# Patient Record
Sex: Male | Born: 1990 | Race: Black or African American | Hispanic: No | Marital: Single
Health system: Southern US, Community
[De-identification: ages and names within clinical notes are randomized; demographics above are authoritative.]

---
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
## 2009-01-02 ENCOUNTER — Emergency Department (HOSPITAL_COMMUNITY): Admission: EM | Admit: 2009-01-02 | Discharge: 2009-01-03 | Payer: Self-pay | Admitting: Emergency Medicine

## 2010-02-27 ENCOUNTER — Emergency Department (HOSPITAL_COMMUNITY): Admission: EM | Admit: 2010-02-27 | Discharge: 2010-02-27 | Payer: Self-pay | Admitting: Emergency Medicine

## 2012-03-30 ENCOUNTER — Emergency Department (INDEPENDENT_AMBULATORY_CARE_PROVIDER_SITE_OTHER): Payer: Self-pay

## 2012-03-30 ENCOUNTER — Emergency Department (HOSPITAL_COMMUNITY): Payer: Self-pay | Admitting: Anesthesiology

## 2012-03-30 ENCOUNTER — Emergency Department (INDEPENDENT_AMBULATORY_CARE_PROVIDER_SITE_OTHER)
Admission: EM | Admit: 2012-03-30 | Discharge: 2012-03-30 | Disposition: A | Discharge status: Other Acute Inpatient Hospital | Payer: Self-pay | Source: Home / Self Care | Attending: Family Medicine | Admitting: Family Medicine

## 2012-03-30 ENCOUNTER — Encounter (HOSPITAL_COMMUNITY)
Admission: EM | Disposition: A | Discharge status: Other Acute Inpatient Hospital | Payer: Self-pay | Source: Home / Self Care | Attending: Family Medicine

## 2012-03-30 ENCOUNTER — Emergency Department (HOSPITAL_COMMUNITY)
Admission: EM | Admit: 2012-03-30 | Discharge: 2012-03-31 | Disposition: A | Discharge status: Home / Self Care | Payer: Self-pay | Attending: Otolaryngology | Admitting: Otolaryngology

## 2012-03-30 ENCOUNTER — Encounter (HOSPITAL_COMMUNITY): Payer: Self-pay

## 2012-03-30 ENCOUNTER — Encounter (HOSPITAL_COMMUNITY): Payer: Self-pay | Admitting: Anesthesiology

## 2012-03-30 ENCOUNTER — Encounter (HOSPITAL_COMMUNITY): Payer: Self-pay | Admitting: Emergency Medicine

## 2012-03-30 ENCOUNTER — Encounter (HOSPITAL_COMMUNITY): Admission: EM | Disposition: A | Discharge status: Home / Self Care | Payer: Self-pay | Source: Home / Self Care

## 2012-03-30 DIAGNOSIS — S02609A Fracture of mandible, unspecified, initial encounter for closed fracture: Secondary | ICD-10-CM

## 2012-03-30 HISTORY — PX: ORIF MANDIBULAR FRACTURE: SHX2127

## 2012-03-30 SURGERY — CLOSED REDUCTION, MANDIBLE, WITH ARCH BAR APPLICATION AND INTERMAXILLARY FIXATION
Anesthesia: General

## 2012-03-30 SURGERY — OPEN REDUCTION INTERNAL FIXATION (ORIF) MANDIBULAR FRACTURE
Anesthesia: General | Site: Mouth | Wound class: Clean Contaminated

## 2012-03-30 MED ORDER — ONDANSETRON HCL 4 MG/2ML IJ SOLN
INTRAMUSCULAR | Status: DC | PRN
  Administered 2012-03-30: 4 mg via INTRAVENOUS

## 2012-03-30 MED ORDER — DROPERIDOL 2.5 MG/ML IJ SOLN
INTRAMUSCULAR | Status: DC | PRN
  Administered 2012-03-30: 0.625 mg via INTRAVENOUS

## 2012-03-30 MED ORDER — LIDOCAINE HCL 4 % MT SOLN
OROMUCOSAL | Status: DC | PRN
  Administered 2012-03-30: 4 mL via TOPICAL

## 2012-03-30 MED ORDER — CLINDAMYCIN PHOSPHATE 600 MG/50ML IV SOLN
INTRAVENOUS | Status: DC | PRN
  Administered 2012-03-30: 600 mg via INTRAVENOUS

## 2012-03-30 MED ORDER — MIDAZOLAM HCL 5 MG/5ML IJ SOLN
INTRAMUSCULAR | Status: DC | PRN
  Administered 2012-03-30: 2 mg via INTRAVENOUS

## 2012-03-30 MED ORDER — GLYCOPYRROLATE 0.2 MG/ML IJ SOLN
INTRAMUSCULAR | Status: DC | PRN
  Administered 2012-03-30: 0.2 mg via INTRAVENOUS

## 2012-03-30 MED ORDER — LACTATED RINGERS IV SOLN
INTRAVENOUS | Status: DC | PRN
  Administered 2012-03-30 – 2012-03-31 (×2): via INTRAVENOUS

## 2012-03-30 MED ORDER — DEXAMETHASONE SODIUM PHOSPHATE 10 MG/ML IJ SOLN: 10.0000 mg | Freq: Once | INTRAMUSCULAR | Status: DC

## 2012-03-30 MED ORDER — DEXAMETHASONE SODIUM PHOSPHATE 4 MG/ML IJ SOLN
INTRAMUSCULAR | Status: DC | PRN
  Administered 2012-03-30: 10 mg via INTRAVENOUS

## 2012-03-30 MED ORDER — SUFENTANIL CITRATE 50 MCG/ML IV SOLN
INTRAVENOUS | Status: DC | PRN
  Administered 2012-03-30: 30 ug via INTRAVENOUS
  Administered 2012-03-30: 20 ug via INTRAVENOUS

## 2012-03-30 MED ORDER — LIDOCAINE HCL (CARDIAC) 20 MG/ML IV SOLN
INTRAVENOUS | Status: DC | PRN
  Administered 2012-03-30: 100 mg via INTRAVENOUS

## 2012-03-30 MED ORDER — SUCCINYLCHOLINE CHLORIDE 20 MG/ML IJ SOLN
INTRAMUSCULAR | Status: DC | PRN
  Administered 2012-03-30: 100 mg via INTRAVENOUS

## 2012-03-30 MED ORDER — PROPOFOL 10 MG/ML IV EMUL
INTRAVENOUS | Status: DC | PRN
  Administered 2012-03-30: 160 mg via INTRAVENOUS
  Administered 2012-03-30: 40 mg via INTRAVENOUS

## 2012-03-30 SURGICAL SUPPLY — 41 items
CANISTER SUCTION 2500CC (MISCELLANEOUS) ×3 IMPLANT
CLEANER TIP ELECTROSURG 2X2 (MISCELLANEOUS) ×3 IMPLANT
CLOTH BEACON ORANGE TIMEOUT ST (SAFETY) ×3 IMPLANT
ELECT COATED BLADE 2.86 ST (ELECTRODE) ×3 IMPLANT
ELECT NEEDLE TIP 2.8 STRL (NEEDLE) IMPLANT
ELECT REM PT RETURN 9FT ADLT (ELECTROSURGICAL) ×3
ELECTRODE REM PT RTRN 9FT ADLT (ELECTROSURGICAL) ×2 IMPLANT
GLOVE BIOGEL M 7.0 STRL (GLOVE) ×3 IMPLANT
GLOVE BIOGEL PI IND STRL 7.5 (GLOVE) ×2 IMPLANT
GLOVE BIOGEL PI INDICATOR 7.5 (GLOVE) ×1
GLOVE SURG SS PI 7.0 STRL IVOR (GLOVE) ×3 IMPLANT
GOWN STRL NON-REIN LRG LVL3 (GOWN DISPOSABLE) ×6 IMPLANT
KIT BASIN OR (CUSTOM PROCEDURE TRAY) ×3 IMPLANT
KIT ROOM TURNOVER OR (KITS) ×3 IMPLANT
NS IRRIG 1000ML POUR BTL (IV SOLUTION) ×3 IMPLANT
PAD ARMBOARD 7.5X6 YLW CONV (MISCELLANEOUS) ×6 IMPLANT
PATTIES SURGICAL .5 X3 (DISPOSABLE) IMPLANT
PENCIL BUTTON HOLSTER BLD 10FT (ELECTRODE) ×3 IMPLANT
SCISSORS WIRE DISP (INSTRUMENTS) ×3 IMPLANT
SCREW UPPER FACE 2.0X12MM (Screw) ×6 IMPLANT
SCREW UPPER FACE 2.0X8MM (Screw) ×6 IMPLANT
STAPLER VISISTAT 35W (STAPLE) ×3 IMPLANT
SUT BONE WAX W31G (SUTURE) IMPLANT
SUT CHROMIC 3 0 PS 2 (SUTURE) IMPLANT
SUT ETHILON 4 0 P 3 18 (SUTURE) IMPLANT
SUT ETHILON 5 0 P 3 18 (SUTURE)
SUT NYLON ETHILON 5-0 P-3 1X18 (SUTURE) IMPLANT
SUT SILK 3 0 (SUTURE)
SUT SILK 3-0 18XBRD TIE 12 (SUTURE) IMPLANT
SUT STEEL 0 (SUTURE)
SUT STEEL 0 18XMFL TIE 17 (SUTURE) IMPLANT
SUT STEEL 2 (SUTURE) ×3 IMPLANT
SUT VIC AB 3-0 FS2 27 (SUTURE) IMPLANT
SUT VIC AB 4-0 P-3 18X BRD (SUTURE) IMPLANT
SUT VIC AB 4-0 P3 18 (SUTURE)
SUT VIC AB 5-0 P-3 18XBRD (SUTURE) IMPLANT
SUT VIC AB 5-0 P3 18 (SUTURE)
TOWEL OR 17X24 6PK STRL BLUE (TOWEL DISPOSABLE) ×3 IMPLANT
TOWEL OR 17X26 10 PK STRL BLUE (TOWEL DISPOSABLE) ×3 IMPLANT
TRAY ENT MC OR (CUSTOM PROCEDURE TRAY) ×3 IMPLANT
WATER STERILE IRR 1000ML POUR (IV SOLUTION) IMPLANT
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
## 2012-03-30 NOTE — ED Notes (Signed)
Call to OR ok to bring pt to OR after IV start the will get permit signed in the OR per Arline Asp RN pt to be brought to PACU

## 2012-03-30 NOTE — Preoperative (Signed)
Beta Blockers   Reason not to administer Beta Blockers:Not Applicable 

## 2012-03-30 NOTE — Anesthesia Preprocedure Evaluation (Signed)
Anesthesia Evaluation  Patient identified by MRN, date of birth, ID band Patient awake    Reviewed: Allergy & Precautions, H&P , NPO status , Patient's Chart, lab work & pertinent test results  Airway  TM Distance: >3 FB Neck ROM: Limited  Mouth opening: Limited Mouth Opening  Dental   Pulmonary Current Smoker,    Pulmonary exam normal       Cardiovascular     Neuro/Psych    GI/Hepatic   Endo/Other    Renal/GU      Musculoskeletal   Abdominal   Peds  Hematology   Anesthesia Other Findings Unable to assess Mallampati due to jaw pain  Reproductive/Obstetrics                           Anesthesia Physical Anesthesia Plan  ASA: II and Emergent  Anesthesia Plan: General   Post-op Pain Management:    Induction: Intravenous  Airway Management Planned: Nasal ETT and Video Laryngoscope Planned  Additional Equipment:   Intra-op Plan:   Post-operative Plan: Extubation in OR  Informed Consent: I have reviewed the patients History and Physical, chart, labs and discussed the procedure including the risks, benefits and alternatives for the proposed anesthesia with the patient or authorized representative who has indicated his/her understanding and acceptance.     Plan Discussed with: CRNA and Surgeon  Anesthesia Plan Comments:         Anesthesia Quick Evaluation
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
## 2012-03-31 MED ORDER — HYDROMORPHONE HCL PF 1 MG/ML IJ SOLN
INTRAMUSCULAR | Status: AC
  Filled 2012-03-31: qty 1

## 2012-03-31 MED ORDER — HYDROCODONE-ACETAMINOPHEN 7.5-500 MG/15ML PO SOLN: ORAL | Status: AC

## 2012-03-31 MED ORDER — LORAZEPAM 2 MG/ML IJ SOLN: 1.0000 mg | Freq: Once | INTRAMUSCULAR | Status: DC | PRN

## 2012-03-31 MED ORDER — 0.9 % SODIUM CHLORIDE (POUR BTL) OPTIME
TOPICAL | Status: DC | PRN
  Administered 2012-03-31: 1000 mL

## 2012-03-31 MED ORDER — LIDOCAINE-EPINEPHRINE 1 %-1:100000 IJ SOLN
INTRAMUSCULAR | Status: DC | PRN
  Administered 2012-03-31: 4 mL

## 2012-03-31 MED ORDER — HYDROMORPHONE HCL PF 1 MG/ML IJ SOLN
0.2500 mg | INTRAMUSCULAR | Status: DC | PRN
  Administered 2012-03-31 (×4): 0.5 mg via INTRAVENOUS

## 2012-03-31 MED ORDER — CLINDAMYCIN HCL 300 MG PO CAPS: 300.0000 mg | ORAL_CAPSULE | Freq: Three times a day (TID) | ORAL | Status: AC

## 2012-03-31 MED FILL — Bacitracin-Polymyxin-Neomycin HC Oint 1%: CUTANEOUS | Qty: 15 | Status: AC

## 2012-03-31 MED FILL — Oxymetazoline HCl Nasal Soln 0.05%: NASAL | Qty: 15 | Status: AC

## 2012-03-31 NOTE — Anesthesia Postprocedure Evaluation (Signed)
  Anesthesia Post-op Note  Patient: Jesus [name]  Procedure(s) Performed: Procedure(s) (LRB): OPEN REDUCTION INTERNAL FIXATION (ORIF) MANDIBULAR FRACTURE (N/A)  Patient Location: PACU  Anesthesia Type: General  Level of Consciousness: awake and alert   Airway and Oxygen Therapy: Patient Spontanous Breathing  Post-op Pain: mild  Post-op Assessment: Post-op Vital signs reviewed, Patient's Cardiovascular Status Stable, Respiratory Function Stable, Patent Airway, No signs of Nausea or vomiting and Pain level controlled  Post-op Vital Signs: stable  Complications: No apparent anesthesia complications

## 2012-03-31 NOTE — Brief Op Note (Signed)
03/30/2012 - 03/31/2012  12:34 AM  PATIENT:  Jesus [name]  21 y.o. male  PRE-OPERATIVE DIAGNOSIS:  Fractured Mandible  POST-OPERATIVE DIAGNOSIS:  Fractured Mandible  PROCEDURE:  Procedure(s) (LRB): OPEN REDUCTION with MANDIBULO-MAXILLARY FIXATION  SURGEON:  Surgeon(s) and Role:    * Osborn Coho, MD - Primary  PHYSICIAN ASSISTANT:   ASSISTANTS: none   ANESTHESIA:   general  EBL:  Total I/O In: 1000 [I.V.:1000] Out: - < 50cc  BLOOD ADMINISTERED:none  DRAINS: none   LOCAL MEDICATIONS USED:  LIDOCAINE  and Amount: 4 ml  SPECIMEN:  No Specimen  DISPOSITION OF SPECIMEN:  PATHOLOGY  COUNTS:  YES  TOURNIQUET:  * No tourniquets in log *  DICTATION: .Other Dictation: Dictation Number [id number]  PLAN OF CARE: Discharge to home after PACU  PATIENT DISPOSITION:  PACU - hemodynamically stable.   Delay start of Pharmacological VTE agent (>24hrs) due to surgical blood loss or risk of bleeding: not applicable
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
## 2012-03-31 NOTE — Transfer of Care (Signed)
Immediate Anesthesia Transfer of Care Note  Patient: Jesus [name]  Procedure(s) Performed: Procedure(s) (LRB): OPEN REDUCTION INTERNAL FIXATION (ORIF) MANDIBULAR FRACTURE (N/A)  Patient Location: PACU  Anesthesia Type: General  Level of Consciousness: awake, alert , oriented, sedated, patient cooperative and responds to stimulation  Airway & Oxygen Therapy: Patient Spontanous Breathing and Patient connected to nasal cannula oxygen  Post-op Assessment: Report given to PACU RN, Post -op Vital signs reviewed and stable and Patient moving all extremities  Post vital signs: Reviewed and stable  Complications: No apparent anesthesia complications

## 2012-03-31 NOTE — OR Nursing (Signed)
Entrance time to OR 2315 03/30/12.

## 2012-04-02 ENCOUNTER — Encounter (HOSPITAL_COMMUNITY): Payer: Self-pay | Admitting: Otolaryngology

## 2012-05-21 ENCOUNTER — Emergency Department (HOSPITAL_COMMUNITY)
Admission: EM | Admit: 2012-05-21 | Discharge: 2012-05-21 | Disposition: A | Discharge status: Home / Self Care | Payer: Self-pay | Attending: Emergency Medicine | Admitting: Emergency Medicine

## 2012-05-21 ENCOUNTER — Encounter (HOSPITAL_COMMUNITY): Payer: Self-pay

## 2012-05-21 DIAGNOSIS — K1379 Other lesions of oral mucosa: Secondary | ICD-10-CM

## 2012-05-21 DIAGNOSIS — R6884 Jaw pain: Secondary | ICD-10-CM | POA: Insufficient documentation

## 2012-05-21 MED ORDER — HYDROCODONE-ACETAMINOPHEN 7.5-500 MG/15ML PO SOLN: 10.0000 mL | Freq: Four times a day (QID) | ORAL | Status: DC | PRN
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
## 2012-05-21 NOTE — ED Provider Notes (Signed)
History  Scribed for Jesus Razor, MD, the patient was seen in room TR05C/TR05C. This chart was scribed by Candelaria Stagers. The patient's care started at 2:15 PM   CSN: [id number]  Arrival date & time 05/21/12  1307   First MD Initiated Contact with Patient 05/21/12 1411      Chief Complaint  Patient presents with  . Dental Pain   The history is provided by the patient.   Jesus [name] is a 21 y.o. male who presents to the Emergency Department complaining of increased right upper jaw pain after mandible fracture and surgery about one month ago.  Pt currently has mouth wired shut.  Pt reports that he spoke with MD earlier today and was referred here for imaging.  He is experiencing no other sx.       History reviewed. No pertinent past medical history.  Past Surgical History  Procedure Date  . Orif mandibular fracture 03/30/2012    Procedure: OPEN REDUCTION INTERNAL FIXATION (ORIF) MANDIBULAR FRACTURE;  Surgeon: Osborn Coho, MD;  Location: [facility] OR;  Service: ENT;  Laterality: N/A;    No family history on file.  History  Substance Use Topics  . Smoking status: Current Everyday Smoker  . Smokeless tobacco: Not on file  . Alcohol Use: Yes      Review of Systems  Constitutional: Negative for fever.       10 Systems reviewed and are negative for acute change except as noted in the HPI.  HENT: Negative for congestion.        Right jaw pain  Eyes: Negative for discharge and redness.  Respiratory: Negative for cough and shortness of breath.   Cardiovascular: Negative for chest pain.  Gastrointestinal: Negative for vomiting and abdominal pain.  Musculoskeletal: Negative for back pain.  Skin: Negative for rash.  Neurological: Negative for syncope, numbness and headaches.  Psychiatric/Behavioral:       No behavior change.    Allergies  Review of patient's allergies indicates no known allergies.  Home Medications  No current outpatient prescriptions on file.  BP  140/89  Pulse 66  Temp 97.4 F (36.3 C) (Oral)  Resp 16  Ht 5\' 7"  (1.702 m)  Wt 130 lb (58.968 kg)  BMI 20.36 kg/m2  SpO2 100%  Physical Exam  Nursing note and vitals reviewed. Constitutional:       Awake, alert, nontoxic appearance.  HENT:  Head: Atraumatic.       Jaw wired bilaterally. Mild irritation to buccal mucosa on right area where wires are located.   Eyes: Right eye exhibits no discharge. Left eye exhibits no discharge.  Neck: Neck supple.  Pulmonary/Chest: Effort normal. He exhibits no tenderness.  Abdominal: Soft. There is no tenderness. There is no rebound.  Musculoskeletal: He exhibits no tenderness.       Baseline ROM, no obvious new focal weakness.  Neurological:       Mental status and motor strength appears baseline for patient and situation.  Skin: No rash noted.  Psychiatric: He has a normal mood and affect.    ED Course  Procedures DIAGNOSTIC STUDIES: Oxygen Saturation is 100% on room air, normal by my interpretation.    COORDINATION OF CARE:    Labs Reviewed - No data to display No results found.   1. Mouth pain       MDM   21 year old male with cheek pain. This is from irritation from the wires of the ORIF of his mandible fracture. Patient states that he  was told to come the emergency room for x-rays. He denies any acute change in his pain or new trauma. There is no indication for emergent imaging at this time. Has scheduled ENT followup. Return precautions were discussed.  I personally preformed the services scribed in my presence. The recorded information has been reviewed and considered. Jesus Razor, MD.        Jesus Razor, MD 05/29/12 [phone]

## 2012-05-22 ENCOUNTER — Other Ambulatory Visit (HOSPITAL_COMMUNITY): Payer: Self-pay | Admitting: Otolaryngology

## 2012-05-22 ENCOUNTER — Ambulatory Visit (HOSPITAL_COMMUNITY)
Admission: RE | Admit: 2012-05-22 | Discharge: 2012-05-22 | Disposition: A | Discharge status: Home / Self Care | Payer: Self-pay | Source: Ambulatory Visit | Attending: Otolaryngology | Admitting: Otolaryngology

## 2012-05-22 DIAGNOSIS — S02650A Fracture of angle of mandible, unspecified side, initial encounter for closed fracture: Secondary | ICD-10-CM | POA: Insufficient documentation

## 2012-05-22 DIAGNOSIS — T148XXA Other injury of unspecified body region, initial encounter: Secondary | ICD-10-CM

## 2012-05-22 DIAGNOSIS — X58XXXA Exposure to other specified factors, initial encounter: Secondary | ICD-10-CM | POA: Insufficient documentation

## 2012-05-22 DIAGNOSIS — Z09 Encounter for follow-up examination after completed treatment for conditions other than malignant neoplasm: Secondary | ICD-10-CM | POA: Insufficient documentation

## 2012-05-26 ENCOUNTER — Encounter (HOSPITAL_BASED_OUTPATIENT_CLINIC_OR_DEPARTMENT_OTHER): Payer: Self-pay | Admitting: *Deleted

## 2012-05-27 ENCOUNTER — Encounter (HOSPITAL_BASED_OUTPATIENT_CLINIC_OR_DEPARTMENT_OTHER): Payer: Self-pay | Admitting: *Deleted

## 2012-05-27 NOTE — Progress Notes (Signed)
Bring all medications

## 2012-05-29 ENCOUNTER — Ambulatory Visit (HOSPITAL_BASED_OUTPATIENT_CLINIC_OR_DEPARTMENT_OTHER)
Admission: RE | Admit: 2012-05-29 | Discharge: 2012-05-29 | Disposition: A | Discharge status: Home / Self Care | Payer: Self-pay | Source: Ambulatory Visit | Attending: Otolaryngology | Admitting: Otolaryngology

## 2012-05-29 ENCOUNTER — Encounter (HOSPITAL_BASED_OUTPATIENT_CLINIC_OR_DEPARTMENT_OTHER): Payer: Self-pay | Admitting: Anesthesiology

## 2012-05-29 ENCOUNTER — Encounter (HOSPITAL_BASED_OUTPATIENT_CLINIC_OR_DEPARTMENT_OTHER)
Admission: RE | Disposition: A | Discharge status: Home / Self Care | Payer: Self-pay | Source: Ambulatory Visit | Attending: Otolaryngology

## 2012-05-29 ENCOUNTER — Encounter (HOSPITAL_BASED_OUTPATIENT_CLINIC_OR_DEPARTMENT_OTHER): Payer: Self-pay | Admitting: Otolaryngology

## 2012-05-29 ENCOUNTER — Encounter (HOSPITAL_BASED_OUTPATIENT_CLINIC_OR_DEPARTMENT_OTHER): Payer: Self-pay | Admitting: *Deleted

## 2012-05-29 ENCOUNTER — Ambulatory Visit (HOSPITAL_BASED_OUTPATIENT_CLINIC_OR_DEPARTMENT_OTHER): Payer: Self-pay | Admitting: Anesthesiology

## 2012-05-29 DIAGNOSIS — F17200 Nicotine dependence, unspecified, uncomplicated: Secondary | ICD-10-CM | POA: Insufficient documentation

## 2012-05-29 DIAGNOSIS — Z4789 Encounter for other orthopedic aftercare: Secondary | ICD-10-CM | POA: Insufficient documentation

## 2012-05-29 DIAGNOSIS — S02609A Fracture of mandible, unspecified, initial encounter for closed fracture: Secondary | ICD-10-CM

## 2012-05-29 HISTORY — PX: MANDIBULAR HARDWARE REMOVAL: SHX5205

## 2012-05-29 SURGERY — REMOVAL, HARDWARE, MANDIBLE
Anesthesia: General | Site: Mouth | Wound class: Clean Contaminated

## 2012-05-29 MED ORDER — LIDOCAINE HCL (CARDIAC) 20 MG/ML IV SOLN
INTRAVENOUS | Status: DC | PRN
  Administered 2012-05-29: 50 mg via INTRAVENOUS

## 2012-05-29 MED ORDER — FENTANYL CITRATE 0.05 MG/ML IJ SOLN: 50.0000 ug | INTRAMUSCULAR | Status: DC | PRN

## 2012-05-29 MED ORDER — ONDANSETRON HCL 4 MG/2ML IJ SOLN: 4.0000 mg | Freq: Four times a day (QID) | INTRAMUSCULAR | Status: DC | PRN

## 2012-05-29 MED ORDER — FENTANYL CITRATE 0.05 MG/ML IJ SOLN
INTRAMUSCULAR | Status: DC | PRN
  Administered 2012-05-29: 50 ug via INTRAVENOUS

## 2012-05-29 MED ORDER — PROPOFOL 10 MG/ML IV BOLUS
INTRAVENOUS | Status: DC | PRN
  Administered 2012-05-29: 200 ug via INTRAVENOUS

## 2012-05-29 MED ORDER — DEXAMETHASONE SODIUM PHOSPHATE 4 MG/ML IJ SOLN
INTRAMUSCULAR | Status: DC | PRN
  Administered 2012-05-29: 10 mg via INTRAVENOUS

## 2012-05-29 MED ORDER — CLINDAMYCIN HCL 300 MG PO CAPS: 300.0000 mg | ORAL_CAPSULE | Freq: Three times a day (TID) | ORAL | Status: AC

## 2012-05-29 MED ORDER — HYDROMORPHONE HCL PF 1 MG/ML IJ SOLN
0.2500 mg | INTRAMUSCULAR | Status: DC | PRN
  Administered 2012-05-29 (×6): 0.5 mg via INTRAVENOUS

## 2012-05-29 MED ORDER — LACTATED RINGERS IV SOLN
INTRAVENOUS | Status: DC
  Administered 2012-05-29 (×2): via INTRAVENOUS

## 2012-05-29 MED ORDER — LIDOCAINE-EPINEPHRINE 1 %-1:100000 IJ SOLN
INTRAMUSCULAR | Status: DC | PRN
  Administered 2012-05-29: 3 mL

## 2012-05-29 MED ORDER — MIDAZOLAM HCL 2 MG/2ML IJ SOLN: 1.0000 mg | INTRAMUSCULAR | Status: DC | PRN

## 2012-05-29 MED ORDER — CEFAZOLIN SODIUM-DEXTROSE 2-3 GM-% IV SOLR
INTRAVENOUS | Status: DC | PRN
  Administered 2012-05-29: 2 g via INTRAVENOUS

## 2012-05-29 MED ORDER — ONDANSETRON HCL 4 MG/2ML IJ SOLN
INTRAMUSCULAR | Status: DC | PRN
  Administered 2012-05-29: 4 mg via INTRAVENOUS

## 2012-05-29 SURGICAL SUPPLY — 31 items
BLADE SURG 15 STRL LF DISP TIS (BLADE) ×1 IMPLANT
BLADE SURG 15 STRL SS (BLADE) ×1
CANISTER SUCTION 1200CC (MISCELLANEOUS) ×2 IMPLANT
CLOTH BEACON ORANGE TIMEOUT ST (SAFETY) ×2 IMPLANT
COVER MAYO STAND STRL (DRAPES) ×2 IMPLANT
DECANTER SPIKE VIAL GLASS SM (MISCELLANEOUS) IMPLANT
ELECT COATED BLADE 2.86 ST (ELECTRODE) ×2 IMPLANT
ELECT REM PT RETURN 9FT ADLT (ELECTROSURGICAL) ×2
ELECTRODE REM PT RTRN 9FT ADLT (ELECTROSURGICAL) ×1 IMPLANT
GAUZE SPONGE 4X4 12PLY STRL LF (GAUZE/BANDAGES/DRESSINGS) ×4 IMPLANT
GAUZE SPONGE 4X4 16PLY XRAY LF (GAUZE/BANDAGES/DRESSINGS) IMPLANT
GLOVE BIOGEL M 7.0 STRL (GLOVE) ×2 IMPLANT
GLOVE BIOGEL M STRL SZ7.5 (GLOVE) ×2 IMPLANT
GOWN PREVENTION PLUS XLARGE (GOWN DISPOSABLE) ×2 IMPLANT
GOWN PREVENTION PLUS XXLARGE (GOWN DISPOSABLE) ×2 IMPLANT
MARKER SKIN DUAL TIP RULER LAB (MISCELLANEOUS) IMPLANT
NEEDLE 27GAX1X1/2 (NEEDLE) ×2 IMPLANT
NS IRRIG 1000ML POUR BTL (IV SOLUTION) IMPLANT
PACK BASIN DAY SURGERY FS (CUSTOM PROCEDURE TRAY) ×2 IMPLANT
PENCIL BUTTON HOLSTER BLD 10FT (ELECTRODE) ×2 IMPLANT
SCISSORS WIRE ANG 4 3/4 DISP (INSTRUMENTS) IMPLANT
SHEET MEDIUM DRAPE 40X70 STRL (DRAPES) ×2 IMPLANT
SUT CHROMIC 3 0 PS 2 (SUTURE) IMPLANT
SUT CHROMIC 4 0 PS 2 18 (SUTURE) IMPLANT
SUT CHROMIC 4 0 RB 1X27 (SUTURE) ×2 IMPLANT
SYR CONTROL 10ML LL (SYRINGE) ×2 IMPLANT
TOWEL OR 17X24 6PK STRL BLUE (TOWEL DISPOSABLE) ×2 IMPLANT
TRAY DSU PREP LF (CUSTOM PROCEDURE TRAY) IMPLANT
TUBE CONNECTING 20X1/4 (TUBING) ×2 IMPLANT
WATER STERILE IRR 1000ML POUR (IV SOLUTION) IMPLANT
YANKAUER SUCT BULB TIP NO VENT (SUCTIONS) ×2 IMPLANT

## 2012-05-29 NOTE — Transfer of Care (Signed)
Immediate Anesthesia Transfer of Care Note  Patient: Jesus [name]  Procedure(s) Performed: Procedure(s) (LRB): MANDIBULAR HARDWARE REMOVAL (N/A)  Patient Location: PACU  Anesthesia Type: General  Level of Consciousness: awake  Airway & Oxygen Therapy: Patient Spontanous Breathing and Patient connected to face mask oxygen  Post-op Assessment: Report given to PACU RN and Post -op Vital signs reviewed and stable  Post vital signs: Reviewed and stable  Complications: No apparent anesthesia complications

## 2012-05-29 NOTE — Brief Op Note (Signed)
05/29/2012  8:23 AM  PATIENT:  Mariann Laster  21 y.o. male  PRE-OPERATIVE DIAGNOSIS:  jaw fracture  POST-OPERATIVE DIAGNOSIS:  jaw fracture  PROCEDURE:  Procedure(s) (LRB): MANDIBULAR HARDWARE REMOVAL (N/A)  SURGEON:  Surgeon(s) and Role:    * Osborn Coho, MD - Primary  PHYSICIAN ASSISTANT:   ASSISTANTS: none   ANESTHESIA:   general  EBL:   Min  BLOOD ADMINISTERED:none  DRAINS: none   LOCAL MEDICATIONS USED:  LIDOCAINE  and Amount: 3 ml  SPECIMEN:  No Specimen  DISPOSITION OF SPECIMEN:  N/A  COUNTS:  YES  TOURNIQUET:  * No tourniquets in log *  DICTATION: .Other Dictation: Dictation Number [id number]  PLAN OF CARE: Discharge to home after PACU  PATIENT DISPOSITION:  PACU - hemodynamically stable.   Delay start of Pharmacological VTE agent (>24hrs) due to surgical blood loss or risk of bleeding: not applicable

## 2012-05-29 NOTE — H&P (Signed)
Jesus [name] is an 21 y.o. male.   Chief Complaint: MMF hardware HPI: s/p mandible frx x 2 mo.   History reviewed. No pertinent past medical history.  Past Surgical History  Procedure Date  . Orif mandibular fracture 03/30/2012    Procedure: OPEN REDUCTION INTERNAL FIXATION (ORIF) MANDIBULAR FRACTURE;  Surgeon: Osborn Coho, MD;  Location: [facility] OR;  Service: ENT;  Laterality: N/A;    History reviewed. No pertinent family history. Social History:  reports that he has been smoking Cigars.  He has never used smokeless tobacco. He reports that he drinks alcohol. He reports that he does not use illicit drugs.  Allergies: No Known Allergies  Medications Prior to Admission  Medication Sig Dispense Refill  . clindamycin (CLEOCIN) 300 MG capsule Take 300 mg by mouth 3 (three) times daily.      Marland Kitchen HYDROcodone-acetaminophen (LORTAB) 7.5-500 MG/15ML solution Take 10 mLs by mouth every 6 (six) hours as needed for pain.  120 mL  0    No results found for this or any previous visit (from the past 48 hour(s)). No results found.  Review of Systems  Constitutional: Negative.   Respiratory: Negative.   Cardiovascular: Negative.   Musculoskeletal: Negative.   Neurological: Negative.     Blood pressure 114/78, pulse 94, temperature 97.6 F (36.4 C), temperature source Oral, resp. rate 16, height 5\' 6"  (1.676 m), weight 55.566 kg (122 lb 8 oz), SpO2 99.00%. Physical Exam  Constitutional: He is oriented to person, place, and time. He appears well-developed and well-nourished.  HENT:       MMF inplace, Frx stable  Neck: Normal range of motion. Neck supple.  Cardiovascular: Normal rate and regular rhythm.   Respiratory: Effort normal and breath sounds normal.  GI: Soft.  Musculoskeletal: Normal range of motion.  Neurological: He is alert and oriented to person, place, and time.     Assessment/Plan Adm for OP removal of MMF hardware.  Jesus [name] 05/29/2012, 7:33 AM

## 2012-05-29 NOTE — Op Note (Signed)
NAME:  [name], Jesus NO.:  [id number]  MEDICAL RECORD NO.:  [id number]  LOCATION:                                 FACILITY:  PHYSICIAN:  Kinnie Scales. Annalee Genta, M.D.DATE OF BIRTH:  [birth year]  DATE OF PROCEDURE:  05/29/2012 DATE OF DISCHARGE:                              OPERATIVE REPORT   PREOPERATIVE DIAGNOSES: 1. Mandible fracture. 2. Status post mandibulomaxillary fixation.  POSTOPERATIVE DIAGNOSES: 1. Mandible fracture. 2. Status post mandibulomaxillary fixation.  INDICATIONS FOR SURGERY: 1. Mandible fracture. 2. Status post mandibulomaxillary fixation.  SURGICAL PROCEDURE:  Removal of mandibulomaxillary fixation hardware.  ANESTHESIA:  General/mask ventilation.  COMPLICATIONS:  No complications.  BLOOD LOSS:  No blood loss.  The patient transferred from the operating room to the recovery room in stable condition.  BRIEF HISTORY:  The patient is a 21 year old black male, who was admitted to [facility] approximately 2 months ago with an acute mandible fracture.  X-rays including Panorex and CT scan showed a displaced fracture of the left mandibular angle.  Given the patient's history, examination and findings, I recommended closed reduction and mandibulomaxillary fixation, which was performed on an emergency basis at [facility]. Approximately 8 weeks later when the fracture had adequately healed, a repeat Panorex was performed which showed initial healing of the fracture site.  The patient was stable and no symptoms of infection or other findings, and given his history and examination, I recommended removal of his mandibulomaxillary fixation hardware.  The risks and benefits of the procedure were discussed in detail with the patient prior to surgery and he understood and concurred with our plan for surgery which is scheduled as an outpatient under general and sedated anesthesia.  PROCEDURE:  The patient was brought to the  operating room at [facility] on May 29, 2012, placed in supine position on the operating table.  General mask ventilation anesthesia was established without difficulty.  When the patient adequately anesthetized, he was injected with a total of 3 mL of 1% lidocaine and 1:100,000 solution of epinephrine injected in a submucosal fashion overlying the mandibulomaxillary fixation screws.  The patient's wiring was then cut and incisions were made overlying his fixation screws. These were carefully removed, including all wires and hardware. Interrupted 3-0 chromic sutures were placed at the incision site overlying the screws.  The patient's oral cavity was irrigated and suctioned.  He was then awakened from his anesthetic and was transferred from the operating room to the recovery room in stable condition.  There were no complications.  The blood loss was minimal.          ______________________________ Kinnie Scales. Annalee Genta, M.D.     DLS/MEDQ  D:  16/07/9603  T:  05/29/2012  Job:  [id number]

## 2012-05-29 NOTE — Anesthesia Preprocedure Evaluation (Addendum)
Anesthesia Evaluation  Patient identified by MRN, date of birth, ID band Patient awake    Reviewed: Allergy & Precautions, H&P , NPO status , Patient's Chart, lab work & pertinent test results  Airway  TM Distance: >3 FB Neck ROM: full   Comment: Mouth is wired shut.   Dental   Pulmonary Current Smoker,          Cardiovascular     Neuro/Psych    GI/Hepatic   Endo/Other    Renal/GU      Musculoskeletal   Abdominal   Peds  Hematology   Anesthesia Other Findings   Reproductive/Obstetrics                          Anesthesia Physical Anesthesia Plan  ASA: II  Anesthesia Plan: General   Post-op Pain Management:    Induction: Intravenous  Airway Management Planned:   Additional Equipment:   Intra-op Plan:   Post-operative Plan: Extubation in OR  Informed Consent: I have reviewed the patients History and Physical, chart, labs and discussed the procedure including the risks, benefits and alternatives for the proposed anesthesia with the patient or authorized representative who has indicated his/her understanding and acceptance.     Plan Discussed with: CRNA and Surgeon  Anesthesia Plan Comments:         Anesthesia Quick Evaluation

## 2012-05-29 NOTE — Anesthesia Postprocedure Evaluation (Signed)
Anesthesia Post Note  Patient: Jesus [name]  Procedure(s) Performed: Procedure(s) (LRB): MANDIBULAR HARDWARE REMOVAL (N/A)  Anesthesia type: General  Patient location: PACU  Post pain: Pain level controlled and Adequate analgesia  Post assessment: Post-op Vital signs reviewed, Patient's Cardiovascular Status Stable, Respiratory Function Stable, Patent Airway and Pain level controlled  Last Vitals:  Filed Vitals:   05/29/12 0830  BP: 117/62  Pulse: 101  Temp:   Resp: 22    Post vital signs: Reviewed and stable  Level of consciousness: awake, alert  and oriented  Complications: No apparent anesthesia complications

## 2012-05-29 NOTE — Progress Notes (Signed)
Pt. Discharged from PACU PhII to lobby with mother awaiting ride per pt. Request. Ok'd with Consulting civil engineer.

## 2012-05-29 NOTE — Anesthesia Procedure Notes (Addendum)
Date/Time: 05/29/2012 8:03 AM Performed by: Zenia Resides D   Date/Time: 05/29/2012 8:03 AM Performed by: Zenia Resides D Pre-anesthesia Checklist: Patient identified, Emergency Drugs available, Suction available, Patient being monitored and Timeout performed Patient Re-evaluated:Patient Re-evaluated prior to inductionOxygen Delivery Method: Circle system utilized Intubation Type: Inhalational induction Ventilation: Mask ventilation without difficulty and Nasal airway inserted- appropriate to patient size Grade View: Grade I

## 2012-06-02 ENCOUNTER — Encounter (HOSPITAL_BASED_OUTPATIENT_CLINIC_OR_DEPARTMENT_OTHER): Payer: Self-pay | Admitting: Otolaryngology

## 2012-10-22 ENCOUNTER — Encounter (HOSPITAL_COMMUNITY): Payer: Self-pay | Admitting: Emergency Medicine

## 2012-10-22 DIAGNOSIS — R509 Fever, unspecified: Secondary | ICD-10-CM | POA: Insufficient documentation

## 2012-10-22 DIAGNOSIS — R112 Nausea with vomiting, unspecified: Secondary | ICD-10-CM | POA: Insufficient documentation

## 2012-10-22 DIAGNOSIS — R1084 Generalized abdominal pain: Secondary | ICD-10-CM | POA: Insufficient documentation

## 2012-10-22 DIAGNOSIS — F17200 Nicotine dependence, unspecified, uncomplicated: Secondary | ICD-10-CM | POA: Insufficient documentation

## 2012-10-22 DIAGNOSIS — R197 Diarrhea, unspecified: Secondary | ICD-10-CM | POA: Insufficient documentation

## 2012-10-22 NOTE — ED Notes (Addendum)
Pt arrived by ptar. Pt c.o of nv and fever for 2 days. Pt able to tolerate liquids but not solids. BP- 126/86 P 10 R 18 98% RA. Pt also c/o back pain

## 2012-10-23 ENCOUNTER — Emergency Department (HOSPITAL_COMMUNITY)
Admission: EM | Admit: 2012-10-23 | Discharge: 2012-10-23 | Disposition: A | Discharge status: Home / Self Care | Payer: Self-pay | Attending: Emergency Medicine | Admitting: Emergency Medicine

## 2012-10-23 DIAGNOSIS — R197 Diarrhea, unspecified: Secondary | ICD-10-CM

## 2012-10-23 MED ORDER — ONDANSETRON 4 MG PO TBDP
8.0000 mg | ORAL_TABLET | Freq: Once | ORAL | Status: AC
  Administered 2012-10-23: 8 mg via ORAL
  Filled 2012-10-23: qty 2

## 2012-10-23 MED ORDER — PROMETHAZINE HCL 25 MG PO TABS: 25.0000 mg | ORAL_TABLET | Freq: Four times a day (QID) | ORAL | Status: DC | PRN

## 2012-10-23 NOTE — ED Provider Notes (Signed)
History     CSN: [id number]  Arrival date & time 10/22/12  2144   First MD Initiated Contact with Patient 10/23/12 0002      Chief Complaint  Patient presents with  . Emesis  . Fever    (Consider location/radiation/quality/duration/timing/severity/associated sxs/prior treatment) HPI Comments: Patient presents today with a chief complaint of nausea, vomiting, diarrhea, and diffuse abdominal pain.  Abdominal pain does not radiate.  He reports that his symptoms have been present since earlier today.  He also reports that he has felt warm, but has not taken his temperature.  He denies any blood in his emesis or blood in his stool.  He had two episodes of vomiting and three episodes of diarrhea today.  He denies any urinary symptoms.  No prior abdominal surgeries.  Patient is a 22 y.o. male presenting with vomiting. The history is provided by the patient.  Emesis  The emesis has an appearance of stomach contents. Associated symptoms include abdominal pain and diarrhea. Pertinent negatives include no chills and no fever.    History reviewed. No pertinent past medical history.  Past Surgical History  Procedure Date  . Orif mandibular fracture 03/30/2012    Procedure: OPEN REDUCTION INTERNAL FIXATION (ORIF) MANDIBULAR FRACTURE;  Surgeon: Osborn Coho, MD;  Location: [facility] OR;  Service: ENT;  Laterality: N/A;  . Mandibular hardware removal 05/29/2012    Procedure: MANDIBULAR HARDWARE REMOVAL;  Surgeon: Osborn Coho, MD;  Location: [facility];  Service: ENT;  Laterality: N/A;  removal of mmf wires    No family history on file.  History  Substance Use Topics  . Smoking status: Current Every Day Smoker    Types: Cigars  . Smokeless tobacco: Never Used  . Alcohol Use: Yes     Comment: Drinks three beers a week      Review of Systems  Constitutional: Negative for fever and chills.       Subjective fever  Cardiovascular: Negative for chest pain.  Gastrointestinal:  Positive for nausea, vomiting, abdominal pain and diarrhea. Negative for constipation and blood in stool.  Genitourinary: Negative for dysuria, urgency, frequency, hematuria, flank pain, decreased urine volume, scrotal swelling and testicular pain.  All other systems reviewed and are negative.    Allergies  Review of patient's allergies indicates no known allergies.  Home Medications  No current outpatient prescriptions on file.  BP 134/88  Pulse 92  Temp 98.9 F (37.2 C) (Oral)  Resp 16  SpO2 97%  Physical Exam  Nursing note and vitals reviewed. Constitutional: He appears well-developed and well-nourished. No distress.  HENT:  Head: Normocephalic and atraumatic.  Mouth/Throat: Oropharynx is clear and moist.  Neck: Normal range of motion. Neck supple.  Cardiovascular: Normal rate, regular rhythm and normal heart sounds.   Pulmonary/Chest: Effort normal and breath sounds normal.  Abdominal: Soft. Normal appearance and bowel sounds are normal. He exhibits no distension and no mass. There is generalized tenderness. There is no rigidity, no rebound, no guarding, no CVA tenderness, no tenderness at McBurney's point and negative Murphy's sign.  Musculoskeletal: Normal range of motion.  Neurological: He is alert.  Skin: Skin is warm and dry. He is not diaphoretic.  Psychiatric: He has a normal mood and affect.    ED Course  Procedures (including critical care time)  Labs Reviewed - No data to display No results found.   No diagnosis found.  1:29 AM Patient able to tolerate PO liquids after given Zofran.  Will discharge patient home  with antiemetic.  Return precautions given.  MDM  Patient presenting with nausea, vomiting, and diarrhea.  He has mild abdominal pain, but pain is generalized.  No localized abdominal pain.  No rebound or guarding on exam.  Negative Rovsing's.  Patient does not appear to be dehydrated.  Patient given dose of Zofran and pain improved.  Patient  afebrile.  Patient able to tolerate PO liquids.  Therefore, feel that patient can be discharged home.  Patient given Rx for Phenergan.  Return precautions discussed.          Pascal Lux [place], PA-C 10/23/12 [phone]

## 2012-10-23 NOTE — ED Notes (Signed)
ED PA in room with pt 

## 2012-10-27 NOTE — ED Provider Notes (Signed)
Medical screening examination/treatment/procedure(s) were performed by non-physician practitioner and as supervising physician I was immediately available for consultation/collaboration.  Aviel Davalos Lytle Michaels, MD 10/27/12 [phone]

## 2012-12-14 ENCOUNTER — Emergency Department (HOSPITAL_COMMUNITY)
Admission: EM | Admit: 2012-12-14 | Discharge: 2012-12-14 | Disposition: A | Discharge status: Home / Self Care | Payer: Self-pay | Attending: Emergency Medicine | Admitting: Emergency Medicine

## 2012-12-14 ENCOUNTER — Encounter (HOSPITAL_COMMUNITY): Payer: Self-pay | Admitting: Emergency Medicine

## 2012-12-14 DIAGNOSIS — K047 Periapical abscess without sinus: Secondary | ICD-10-CM | POA: Insufficient documentation

## 2012-12-14 DIAGNOSIS — F17200 Nicotine dependence, unspecified, uncomplicated: Secondary | ICD-10-CM | POA: Insufficient documentation

## 2012-12-14 DIAGNOSIS — R220 Localized swelling, mass and lump, head: Secondary | ICD-10-CM | POA: Insufficient documentation

## 2012-12-14 DIAGNOSIS — Z8781 Personal history of (healed) traumatic fracture: Secondary | ICD-10-CM | POA: Insufficient documentation

## 2012-12-14 DIAGNOSIS — R221 Localized swelling, mass and lump, neck: Secondary | ICD-10-CM | POA: Insufficient documentation

## 2012-12-14 MED ORDER — HYDROCODONE-ACETAMINOPHEN 5-325 MG PO TABS: 2.0000 | ORAL_TABLET | ORAL | Status: DC | PRN

## 2012-12-14 MED ORDER — MORPHINE SULFATE 4 MG/ML IJ SOLN
6.0000 mg | Freq: Once | INTRAMUSCULAR | Status: AC
  Administered 2012-12-14: 6 mg via INTRAVENOUS
  Filled 2012-12-14: qty 2

## 2012-12-14 MED ORDER — CLINDAMYCIN PHOSPHATE 900 MG/50ML IV SOLN
900.0000 mg | Freq: Once | INTRAVENOUS | Status: AC
  Administered 2012-12-14: 900 mg via INTRAVENOUS
  Filled 2012-12-14: qty 50

## 2012-12-14 MED ORDER — SODIUM CHLORIDE 0.9 % IV BOLUS (SEPSIS)
1000.0000 mL | Freq: Once | INTRAVENOUS | Status: AC
  Administered 2012-12-14: 1000 mL via INTRAVENOUS

## 2012-12-14 MED ORDER — HYDROMORPHONE HCL PF 1 MG/ML IJ SOLN
1.0000 mg | Freq: Once | INTRAMUSCULAR | Status: AC
  Administered 2012-12-14: 1 mg via INTRAVENOUS
  Filled 2012-12-14: qty 1

## 2012-12-14 MED ORDER — PENICILLIN V POTASSIUM 500 MG PO TABS: 500.0000 mg | ORAL_TABLET | Freq: Three times a day (TID) | ORAL | Status: DC

## 2012-12-14 NOTE — ED Notes (Signed)
C/o abscess in mouth x 2 days.

## 2012-12-14 NOTE — ED Provider Notes (Signed)
History     CSN: [id number]  Arrival date & time 12/14/12  0454   First MD Initiated Contact with Patient 12/14/12 0530      Chief Complaint  Patient presents with  . Abscess    (Consider location/radiation/quality/duration/timing/severity/associated sxs/prior treatment) HPI Size of painful swollen area on right upper gingiva onset 2 or 3 days ago. Pain is worse with chewing areas bite teeth #2 and 3. No fever no other complaint no treatment prior to coming here nothing makes symptoms better pain is nonradiating, moderate to severe. No other associated symptoms History reviewed. No pertinent past medical history.  Past Surgical History  Procedure Laterality Date  . Orif mandibular fracture  03/30/2012    Procedure: OPEN REDUCTION INTERNAL FIXATION (ORIF) MANDIBULAR FRACTURE;  Surgeon: Osborn Coho, MD;  Location: [facility] OR;  Service: ENT;  Laterality: N/A;  . Mandibular hardware removal  05/29/2012    Procedure: MANDIBULAR HARDWARE REMOVAL;  Surgeon: Osborn Coho, MD;  Location: [facility];  Service: ENT;  Laterality: N/A;  removal of mmf wires    No family history on file.  History  Substance Use Topics  . Smoking status: Current Every Day Smoker    Types: Cigars  . Smokeless tobacco: Never Used  . Alcohol Use: Yes     Comment: Drinks three beers a week      Review of Systems  Constitutional: Negative.   HENT:       Gingival pain and swelling, toothache  Respiratory: Negative.   Cardiovascular: Negative.   Endocrine: Negative.   Neurological: Negative.   Hematological: Negative.     Allergies  Review of patient's allergies indicates no known allergies.  Home Medications  No current outpatient prescriptions on file.  BP 153/106  Pulse 71  Temp(Src) 97.3 F (36.3 C) (Oral)  Resp 18  SpO2 98%  Physical Exam  Nursing note and vitals reviewed. Constitutional: He is oriented to person, place, and time. He appears well-developed and  well-nourished. He appears distressed.  Appears uncomfortable Glasgow Coma Score 15 nontoxic appearing  HENT:  Right Ear: External ear normal.  Left Ear: External ear normal.  Nose: Nose normal.  Mouth/Throat: No oropharyngeal exudate.  Gingival abscess lingual surface of teeth #2 and 3. Exquisitely tender. Dental caries at teeth #2 and 3  Eyes: EOM are normal. Pupils are equal, round, and reactive to light.  Neck: Normal range of motion.  Cardiovascular: Normal rate.   Pulmonary/Chest: Effort normal.  Abdominal: He exhibits no distension.  Musculoskeletal: Normal range of motion.  Lymphadenopathy:    He has no cervical adenopathy.  Neurological: He is alert and oriented to person, place, and time. He exhibits normal muscle tone.  Psychiatric: He has a normal mood and affect.    ED Course  Procedures (including critical care time)  Labs Reviewed - No data to display No results found. Procedure gingival abscess was incised and drained by me. A timeout was performed . Patient last ate at 6 PM last night. ASA category 1 the abscess was numbed with 2% lidocaine with epinephrine. A stab incision was made with a #11 blade with moderate amount of pus drained. The patient tolerated the procedure well No diagnosis found.  7:10 AM feels improved after treatment with intravenous fluids, opioids and clindamycin  MDM  Plan prescription Pen-Vee K, [facility] dental referral Diagnosis dental abscess        Doug Sou, MD 12/14/12 [phone]

## 2013-01-20 ENCOUNTER — Emergency Department (HOSPITAL_COMMUNITY)
Admission: EM | Admit: 2013-01-20 | Discharge: 2013-01-20 | Disposition: A | Discharge status: Home / Self Care | Payer: No Typology Code available for payment source | Attending: Emergency Medicine | Admitting: Emergency Medicine

## 2013-01-20 ENCOUNTER — Encounter (HOSPITAL_COMMUNITY): Payer: Self-pay | Admitting: Emergency Medicine

## 2013-01-20 ENCOUNTER — Emergency Department (HOSPITAL_COMMUNITY): Payer: No Typology Code available for payment source

## 2013-01-20 DIAGNOSIS — Y9389 Activity, other specified: Secondary | ICD-10-CM | POA: Insufficient documentation

## 2013-01-20 DIAGNOSIS — S60222A Contusion of left hand, initial encounter: Secondary | ICD-10-CM

## 2013-01-20 DIAGNOSIS — S39012A Strain of muscle, fascia and tendon of lower back, initial encounter: Secondary | ICD-10-CM

## 2013-01-20 DIAGNOSIS — F17200 Nicotine dependence, unspecified, uncomplicated: Secondary | ICD-10-CM | POA: Insufficient documentation

## 2013-01-20 DIAGNOSIS — M25569 Pain in unspecified knee: Secondary | ICD-10-CM | POA: Insufficient documentation

## 2013-01-20 DIAGNOSIS — Y92410 Unspecified street and highway as the place of occurrence of the external cause: Secondary | ICD-10-CM | POA: Insufficient documentation

## 2013-01-20 DIAGNOSIS — S335XXA Sprain of ligaments of lumbar spine, initial encounter: Secondary | ICD-10-CM | POA: Insufficient documentation

## 2013-01-20 DIAGNOSIS — S60229A Contusion of unspecified hand, initial encounter: Secondary | ICD-10-CM | POA: Insufficient documentation

## 2013-01-20 MED ORDER — CYCLOBENZAPRINE HCL 10 MG PO TABS: 10.0000 mg | ORAL_TABLET | Freq: Two times a day (BID) | ORAL | Status: DC | PRN

## 2013-01-20 MED ORDER — IBUPROFEN 800 MG PO TABS
800.0000 mg | ORAL_TABLET | Freq: Once | ORAL | Status: AC
  Administered 2013-01-20: 800 mg via ORAL
  Filled 2013-01-20: qty 1

## 2013-01-20 MED ORDER — NAPROXEN 500 MG PO TABS: 500.0000 mg | ORAL_TABLET | Freq: Two times a day (BID) | ORAL | Status: DC
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
## 2013-01-22 NOTE — ED Provider Notes (Signed)
Medical screening examination/treatment/procedure(s) were performed by non-physician practitioner and as supervising physician I was immediately available for consultation/collaboration.   Hurman Horn, MD 01/22/13 [phone]

## 2013-02-03 ENCOUNTER — Encounter (HOSPITAL_COMMUNITY): Payer: Self-pay | Admitting: Emergency Medicine

## 2013-02-03 ENCOUNTER — Emergency Department (HOSPITAL_COMMUNITY)
Admission: EM | Admit: 2013-02-03 | Discharge: 2013-02-03 | Disposition: A | Discharge status: Home / Self Care | Payer: No Typology Code available for payment source | Attending: Emergency Medicine | Admitting: Emergency Medicine

## 2013-02-03 DIAGNOSIS — Y939 Activity, unspecified: Secondary | ICD-10-CM | POA: Insufficient documentation

## 2013-02-03 DIAGNOSIS — S239XXA Sprain of unspecified parts of thorax, initial encounter: Secondary | ICD-10-CM | POA: Insufficient documentation

## 2013-02-03 DIAGNOSIS — F17200 Nicotine dependence, unspecified, uncomplicated: Secondary | ICD-10-CM | POA: Insufficient documentation

## 2013-02-03 DIAGNOSIS — S29012D Strain of muscle and tendon of back wall of thorax, subsequent encounter: Secondary | ICD-10-CM

## 2013-02-03 DIAGNOSIS — IMO0001 Reserved for inherently not codable concepts without codable children: Secondary | ICD-10-CM | POA: Insufficient documentation

## 2013-02-03 DIAGNOSIS — Y929 Unspecified place or not applicable: Secondary | ICD-10-CM | POA: Insufficient documentation

## 2013-02-03 DIAGNOSIS — Z8781 Personal history of (healed) traumatic fracture: Secondary | ICD-10-CM | POA: Insufficient documentation

## 2013-02-03 MED ORDER — ACETAMINOPHEN 500 MG PO TABS: 500.0000 mg | ORAL_TABLET | Freq: Four times a day (QID) | ORAL | Status: DC | PRN

## 2013-02-03 NOTE — ED Notes (Signed)
Voiced understanding of instructions given 
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
## 2013-08-17 IMAGING — CR DG LUMBAR SPINE COMPLETE 4+V
5 series · 5 of 5 positions shown · non-contrast
Comparison: None.

CLINICAL DATA: Low back pain

LUMBAR SPINE - COMPLETE 4+ VIEW

[t lumbar spine ap]
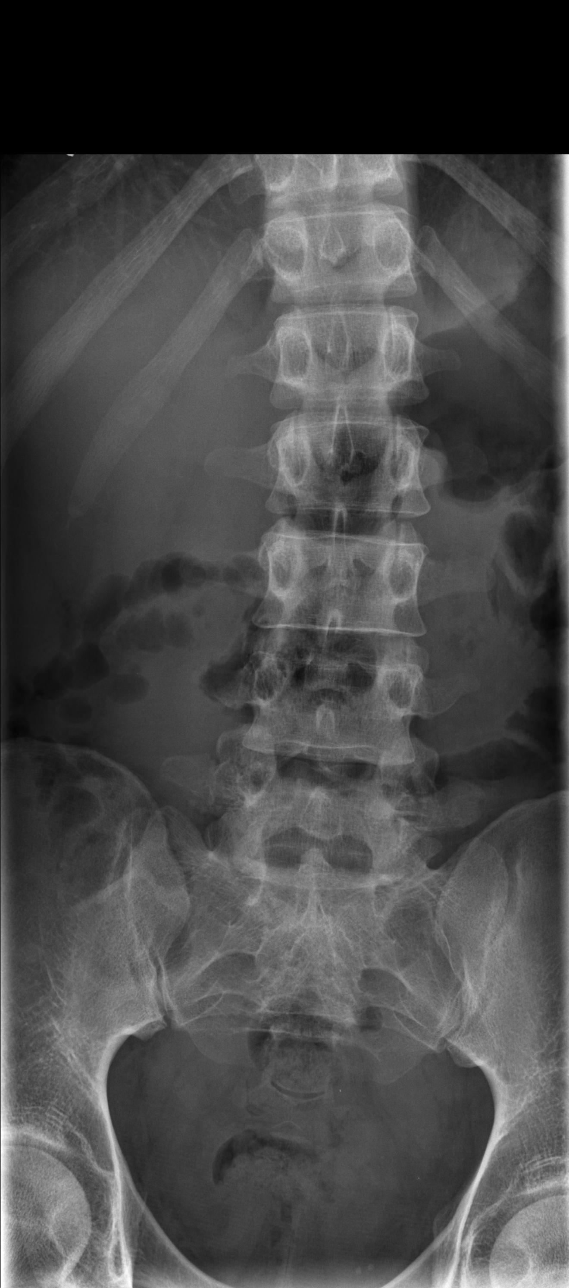

[t lumbar spine obl (1 of 2)]
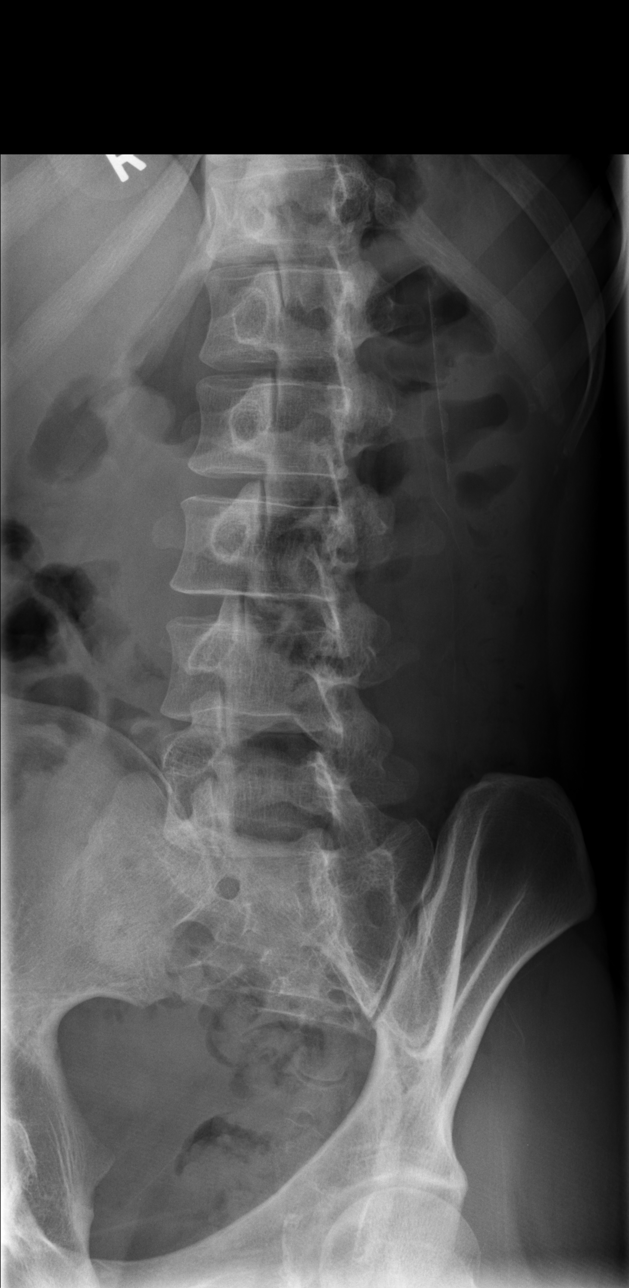

[t lumbar spine obl (2 of 2)]
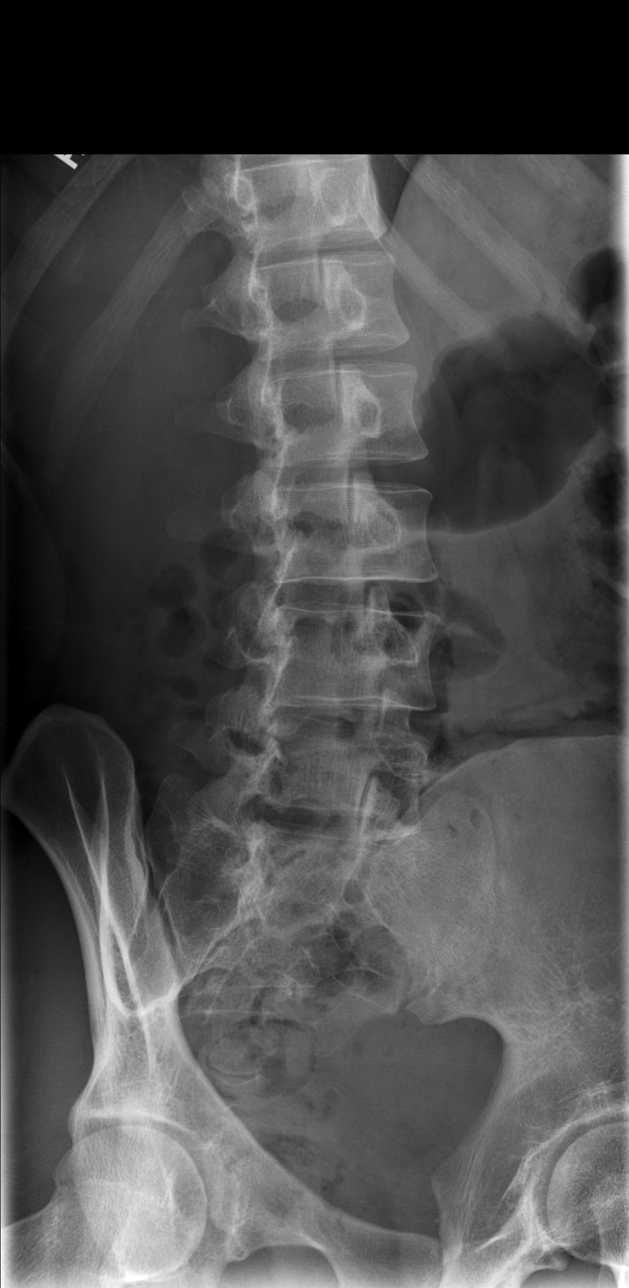

[t lumbar spine lat]
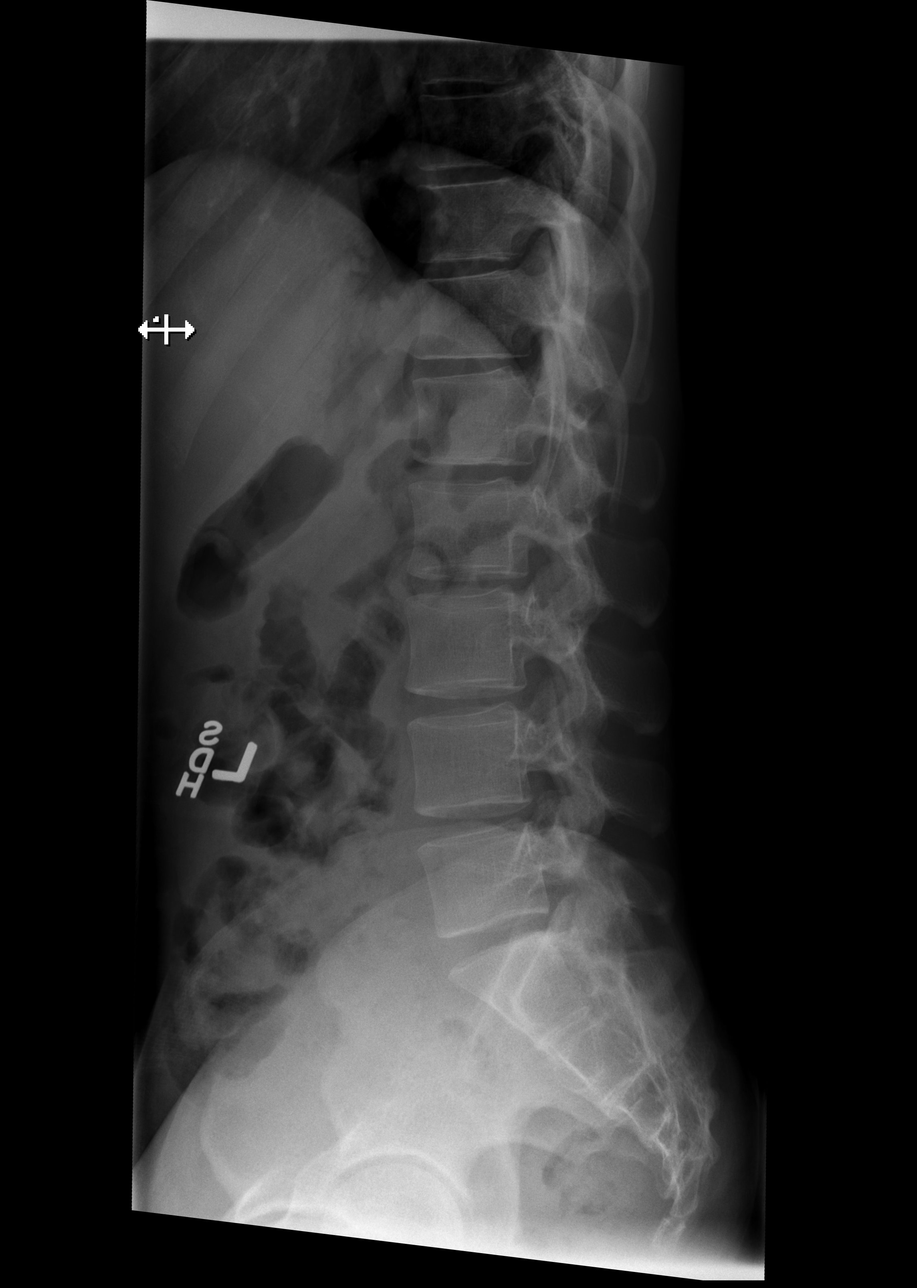

[t lumbar l-5 s-1 spot]
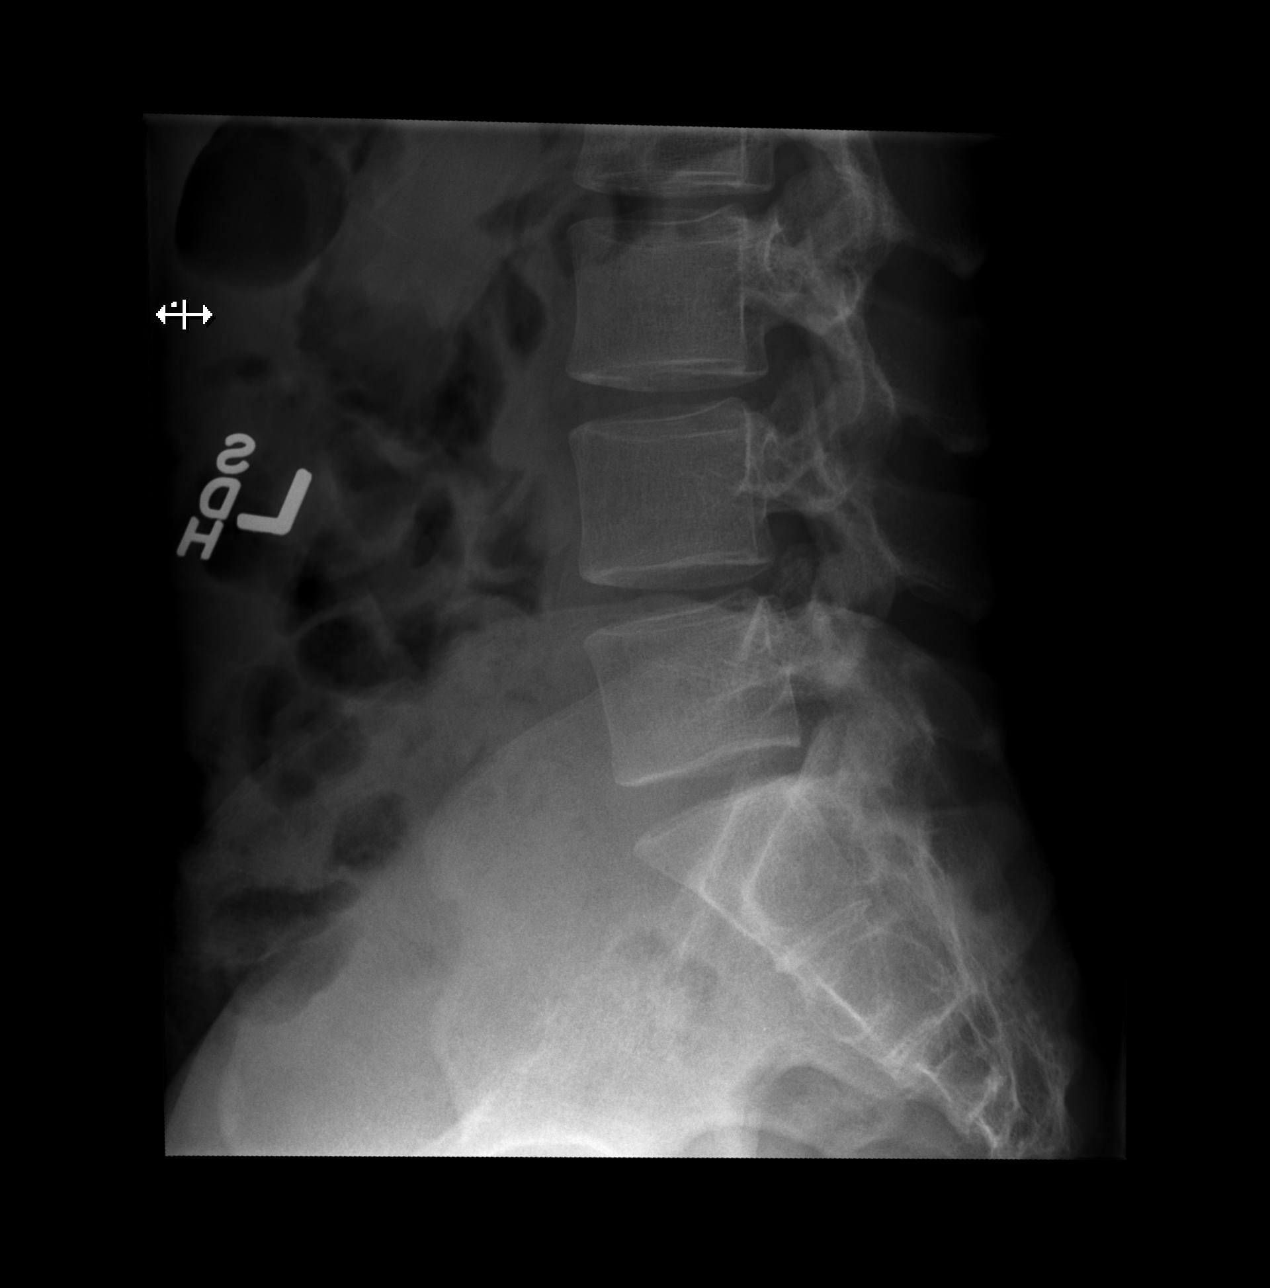

[5 of 5 positions shown; findings below may reference images not displayed]

FINDINGS: Minimal leftward curvature centered at L2 noted.
Intervertebral disc spaces and vertebral body heights are
maintained.  Normal alignment. Minimal apparent superior endplate
deformity at L1 is felt to most likely be projectional due to
curvature.
IMPRESSION: No acute abnormality.
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
## 2013-10-22 ENCOUNTER — Encounter (HOSPITAL_COMMUNITY): Payer: Self-pay | Admitting: Emergency Medicine

## 2013-10-22 ENCOUNTER — Emergency Department (HOSPITAL_COMMUNITY)
Admission: EM | Admit: 2013-10-22 | Discharge: 2013-10-22 | Disposition: A | Discharge status: Home / Self Care | Payer: Self-pay | Attending: Emergency Medicine | Admitting: Emergency Medicine

## 2013-10-22 ENCOUNTER — Emergency Department (HOSPITAL_COMMUNITY): Payer: Self-pay

## 2013-10-22 DIAGNOSIS — F17200 Nicotine dependence, unspecified, uncomplicated: Secondary | ICD-10-CM | POA: Insufficient documentation

## 2013-10-22 DIAGNOSIS — Y92009 Unspecified place in unspecified non-institutional (private) residence as the place of occurrence of the external cause: Secondary | ICD-10-CM

## 2013-10-22 DIAGNOSIS — Y929 Unspecified place or not applicable: Secondary | ICD-10-CM | POA: Insufficient documentation

## 2013-10-22 DIAGNOSIS — S93409A Sprain of unspecified ligament of unspecified ankle, initial encounter: Secondary | ICD-10-CM | POA: Insufficient documentation

## 2013-10-22 DIAGNOSIS — S82832A Other fracture of upper and lower end of left fibula, initial encounter for closed fracture: Secondary | ICD-10-CM

## 2013-10-22 DIAGNOSIS — S93402A Sprain of unspecified ligament of left ankle, initial encounter: Secondary | ICD-10-CM

## 2013-10-22 DIAGNOSIS — S01511A Laceration without foreign body of lip, initial encounter: Secondary | ICD-10-CM

## 2013-10-22 DIAGNOSIS — S01501A Unspecified open wound of lip, initial encounter: Secondary | ICD-10-CM | POA: Insufficient documentation

## 2013-10-22 DIAGNOSIS — Y9301 Activity, walking, marching and hiking: Secondary | ICD-10-CM | POA: Insufficient documentation

## 2013-10-22 DIAGNOSIS — S82839A Other fracture of upper and lower end of unspecified fibula, initial encounter for closed fracture: Secondary | ICD-10-CM | POA: Insufficient documentation

## 2013-10-22 DIAGNOSIS — R209 Unspecified disturbances of skin sensation: Secondary | ICD-10-CM | POA: Insufficient documentation

## 2013-10-22 DIAGNOSIS — IMO0002 Reserved for concepts with insufficient information to code with codable children: Secondary | ICD-10-CM | POA: Insufficient documentation

## 2013-10-22 DIAGNOSIS — W19XXXA Unspecified fall, initial encounter: Secondary | ICD-10-CM

## 2013-10-22 MED ORDER — HYDROMORPHONE HCL PF 1 MG/ML IJ SOLN
1.0000 mg | Freq: Once | INTRAMUSCULAR | Status: AC
  Administered 2013-10-22: 1 mg via INTRAVENOUS

## 2013-10-22 MED ORDER — LORAZEPAM 2 MG/ML IJ SOLN
1.0000 mg | Freq: Once | INTRAMUSCULAR | Status: AC
  Administered 2013-10-22: 01:00:00 via INTRAVENOUS

## 2013-10-22 MED ORDER — LORAZEPAM 2 MG/ML IJ SOLN
INTRAMUSCULAR | Status: AC
  Filled 2013-10-22: qty 1

## 2013-10-22 MED ORDER — HYDROMORPHONE HCL PF 1 MG/ML IJ SOLN
INTRAMUSCULAR | Status: AC
  Filled 2013-10-22: qty 1

## 2013-10-22 MED ORDER — OXYCODONE-ACETAMINOPHEN 5-325 MG PO TABS: 2.0000 | ORAL_TABLET | ORAL | Status: DC | PRN

## 2013-10-22 MED ORDER — OXYCODONE-ACETAMINOPHEN 5-325 MG PO TABS
2.0000 | ORAL_TABLET | Freq: Once | ORAL | Status: DC
  Filled 2013-10-22: qty 2

## 2013-10-22 MED ORDER — METHOCARBAMOL 500 MG PO TABS
1000.0000 mg | ORAL_TABLET | Freq: Once | ORAL | Status: AC
  Administered 2013-10-22: 1000 mg via ORAL
  Filled 2013-10-22: qty 2

## 2013-10-22 MED ORDER — METHOCARBAMOL 750 MG PO TABS: 750.0000 mg | ORAL_TABLET | Freq: Four times a day (QID) | ORAL | Status: DC

## 2013-10-22 NOTE — Discharge Instructions (Signed)
Please follow up with orthopedics.  Return to the ER for worsening pain, swelling of the leg or foot, or new concerning symptoms.   Absorbable Suture Repair Absorbable sutures (stitches) hold skin together so you can heal. Keep skin wounds clean and dry for the next 2 to 3 days. Then, you may gently wash your wound and dress it with an antibiotic ointment as recommended. As your wound begins to heal, the sutures are no longer needed, and they typically begin to fall off. This will take 7 to 10 days. After 10 days, if your sutures are loose, you can remove them by wiping with a clean gauze pad or a cotton ball. Do not pull your sutures out. They should wipe away easily. If after 10 days they do not easily wipe away, have your caregiver take them out. Absorbable sutures may be used deep in a wound to help hold it together. If these stitches are below the skin, the body will absorb them completely in 3 to 4 weeks.  You may need a tetanus shot if:  You cannot remember when you had your last tetanus shot.  You have never had a tetanus shot. If you get a tetanus shot, your arm may swell, get red, and feel warm to the touch. This is common and not a problem. If you need a tetanus shot and you choose not to have one, there is a rare chance of getting tetanus. Sickness from tetanus can be serious. SEEK IMMEDIATE MEDICAL CARE IF:  You have redness in the wound area.  The wound area feels hot to the touch.  You develop swelling in the wound area.  You develop pain.  There is fluid drainage from the wound. Document Released: 10/31/2004 Document Revised: 12/16/2011 Document Reviewed: 02/12/2011 [facility] Patient Information 2014 [place], [place].   Ankle Sprain An ankle sprain is an injury to the strong, fibrous tissues (ligaments) that hold the bones of your ankle joint together.  CAUSES An ankle sprain is usually caused by a fall or by twisting your ankle. Ankle sprains most commonly occur when you  step on the outer edge of your foot, and your ankle turns inward. People who participate in sports are more prone to these types of injuries.  SYMPTOMS   Pain in your ankle. The pain may be present at rest or only when you are trying to stand or walk.  Swelling.  Bruising. Bruising may develop immediately or within 1 to 2 days after your injury.  Difficulty standing or walking, particularly when turning corners or changing directions. DIAGNOSIS  Your caregiver will ask you details about your injury and perform a physical exam of your ankle to determine if you have an ankle sprain. During the physical exam, your caregiver will press on and apply pressure to specific areas of your foot and ankle. Your caregiver will try to move your ankle in certain ways. An X-ray exam may be done to be sure a bone was not broken or a ligament did not separate from one of the bones in your ankle (avulsion fracture).  TREATMENT  Certain types of braces can help stabilize your ankle. Your caregiver can make a recommendation for this. Your caregiver may recommend the use of medicine for pain. If your sprain is severe, your caregiver may refer you to a surgeon who helps to restore function to parts of your skeletal system (orthopedist) or a physical therapist. HOME CARE INSTRUCTIONS   Apply ice to your injury for 1 2  days or as directed by your caregiver. Applying ice helps to reduce inflammation and pain.  Put ice in a plastic bag.  Place a towel between your skin and the bag.  Leave the ice on for 15-20 minutes at a time, every 2 hours while you are awake.  Only take over-the-counter or prescription medicines for pain, discomfort, or fever as directed by your caregiver.  Elevate your injured ankle above the level of your heart as much as possible for 2 3 days.  If your caregiver recommends crutches, use them as instructed. Gradually put weight on the affected ankle. Continue to use crutches or a cane until  you can walk without feeling pain in your ankle.  If you have a plaster splint, wear the splint as directed by your caregiver. Do not rest it on anything harder than a pillow for the first 24 hours. Do not put weight on it. Do not get it wet. You may take it off to take a shower or bath.  You may have been given an elastic bandage to wear around your ankle to provide support. If the elastic bandage is too tight (you have numbness or tingling in your foot or your foot becomes cold and blue), adjust the bandage to make it comfortable.  If you have an air splint, you may blow more air into it or let air out to make it more comfortable. You may take your splint off at night and before taking a shower or bath. Wiggle your toes in the splint several times per day to decrease swelling. SEEK MEDICAL CARE IF:   You have rapidly increasing bruising or swelling.  Your toes feel extremely cold or you lose feeling in your foot.  Your pain is not relieved with medicine. SEEK IMMEDIATE MEDICAL CARE IF:  Your toes are numb or blue.  You have severe pain that is increasing. MAKE SURE YOU:   Understand these instructions.  Will watch your condition.  Will get help right away if you are not doing well or get worse. Document Released: 09/23/2005 Document Revised: 06/17/2012 Document Reviewed: 10/05/2011 [facility] Patient Information 2014 [place], [place].  Fibular Fracture with Rehab The fibula is the smaller of the two lower leg bones and is vulnerable to breaks (fracture). Fibular fractures may go fully through the bone (complete) or partially (incomplete). The bone fragments are rarely out of alignment (displaced fracture). Fibula fractures may occur anywhere along the bone. However, this document only discusses fractures that do not involve a leg joint. Fibular fractures are not often a severe injury, because the bone is supports only about 17% of the body weight. SYMPTOMS   Moderate to severe pain  in the lower leg.  Tenderness and swelling in the leg or calf.  Bleeding and/or bruising (contusion) in the leg.  Inability to bear weight on the injured extremity.  Visible deformity, if the fracture is displaced.  Numbness and coldness in the leg and foot, beyond the fracture site, if blood supply is impaired CAUSES  Fractures occur when a force is placed on the bone that is greater than it can withstand. Common causes of fibular fracture include:  Direct hit (trauma) (i.e. hockey or lacrosse check to the lower leg).  Stress fracture (weakening of the bone from repeated stress).  Indirect injury, caused by twisting, turning quickly, or violent muscle contraction. RISK INCREASES WITH:  Contact sports (i.e. football, soccer, lacrosse, hockey).  Sports that can cause twisted ankle injury (i.e. skiing, basketball).  Bony  abnormalities (ie osteoporosis or bone tumors).  Metabolism disorders, hormone problems, and nutrition deficiency and disorders (i.e. anorexia and bulimia).  Poor strength and flexibility. PREVENTION   Warm up and stretch properly before activity.  Maintain physical fitness:  Strength, flexibility, and endurance.  Cardiovascular fitness.  Wear properly fitted and padded protective equipment (i.e. shin guards for soccer). PROGNOSIS  If treated properly, fibular fractures usually heal in 4 to 6 weeks.  RELATED COMPLICATIONS   Failure of bone to heal(nonunion).  Bone heals in a poor position (malunion).  Increased pressure inside the leg (compartment syndrome), due to injury that disrupts the blood supply to the leg and foot, and injures the nerves and muscles (uncommon).  Shortening of the injured bones.  Hindrance of normal bone growth in children.  Risks of surgery: infection, bleeding, injury to nerves (numbness, weakness, paralysis), need for further surgery.  Longer healing time, if activity is resumed too soon. TREATMENT Treatment first  involves ice, medicine, and elevation of the leg, to reduce pain and inflammation. People with fibular fractures are advised to walk using crutches. A brace or walking boot may be given to restrain the injured leg and allow for healing. Sometimes, surgery is needed to place a rod, plate, or screws in the bones in order to fix the fracture. After surgery, the leg is restrained. After restraint (with or without surgery) it is important to complete strengthening and stretching exercises to regain strength and a full range of motion. Exercises may be completed at home or with a therapist. MEDICATION   If pain medicine is needed, nonsteroidal anti-inflammatory medicines (aspirin and ibuprofen), or other minor pain relievers (acetaminophen), are often advised.  Do not take pain medicine for 7 days before surgery.  Prescription pain relievers may be given if your caregiver thinks they are needed. Use only as directed and only as much as you need. SEEK MEDICAL CARE IF:  Symptoms get worse or do not improve in 2 weeks, despite treatment.  The following occur after restraint or surgery. (Report any of these signs immediately):  Swelling above or below the fracture site.  Severe, persistent pain.  Blue or gray skin below the fracture site, especially under the toenails. Numbness or loss of feeling below the fracture site.  New, unexplained symptoms develop. (Drugs used in treatment may produce side effects.)

## 2013-10-22 NOTE — ED Provider Notes (Signed)
CSN: [id number]     Arrival date & time 10/22/13  0055 History   First MD Initiated Contact with Patient 10/22/13 0107     No chief complaint on file.  (Consider location/radiation/quality/duration/timing/severity/associated sxs/prior Treatment) HPI 23 year old male presents to emergency room via EMS with complaint of fall, left ankle pain, and lip laceration.  Patient reports his outside and slipped, striking a metal pole.  Patient reports he has excruciating pain to his left ankle.  There is soft tissue swelling noted.  EMS reports patient was given 250 mcg of fentanyl and route without improvement.  Patient has been drinking on-call tonight.  He reports past history of mandibular fracture status post repair 2 years ago. History reviewed. No pertinent past medical history. Past Surgical History  Procedure Laterality Date  . Orif mandibular fracture  03/30/2012    Procedure: OPEN REDUCTION INTERNAL FIXATION (ORIF) MANDIBULAR FRACTURE;  Surgeon: Osborn Cohoavid Shoemaker, MD;  [facility];  Service: ENT;  Laterality: N/A;  . Mandibular hardware removal  05/29/2012    Procedure: MANDIBULAR HARDWARE REMOVAL;  Surgeon: Osborn Cohoavid Shoemaker, MD;  Location: [facility];  Service: ENT;  Laterality: N/A;  removal of mmf wires   No family history on file. History  Substance Use Topics  . Smoking status: Current Every Day Smoker    Types: Cigars  . Smokeless tobacco: Never Used  . Alcohol Use: Yes     Comment: Drinks three beers a week    Review of Systems  See History of Present Illness; otherwise all other systems are reviewed and negative Allergies  Review of patient's allergies indicates no known allergies.  Home Medications  No current outpatient prescriptions on file. BP 151/121  Pulse 142  Temp(Src) 98.2 F (36.8 C) (Oral)  Resp 30  Ht 5\' 7"  (1.702 m)  Wt 127 lb (57.607 kg)  BMI 19.89 kg/m2  SpO2 98% Physical Exam  Nursing note and vitals reviewed. Constitutional: He is  oriented to person, place, and time. He appears well-developed and well-nourished. He appears distressed (agitated, patient cannot keep his left leg still.).  HENT:  Head: Normocephalic.  Right Ear: External ear normal.  Left Ear: External ear normal.  Nose: Nose normal.  Mouth/Throat: Oropharynx is clear and moist.  1 cm through and through laceration to left upper lateral lip  Eyes: Conjunctivae and EOM are normal. Pupils are equal, round, and reactive to light.  Neck: Normal range of motion. Neck supple. No JVD present. No tracheal deviation present. No thyromegaly present.  Cardiovascular: Normal rate, regular rhythm, normal heart sounds and intact distal pulses.  Exam reveals no gallop and no friction rub.   No murmur heard. Pulmonary/Chest: Effort normal and breath sounds normal. No stridor. No respiratory distress. He has no wheezes. He has no rales. He exhibits no tenderness.  Abdominal: Soft. Bowel sounds are normal. He exhibits no distension and no mass. There is no tenderness. There is no rebound and no guarding.  Musculoskeletal: Normal range of motion. He exhibits tenderness (tender to palpation to medial and lateral malleoli.  No crepitus.  Pulses are intact.  Patient reports normal sensation.  Soft tissue swelling noted). He exhibits no edema.  Patient has no tenderness to proximal tibia or fibula.  No pain at the knee joint.  No effusion noted.  No deformity.  Calf compartments are soft, no pain with palpation of anterior, lateral or posterior compartments of the left lower leg.  Lymphadenopathy:    He has no cervical adenopathy.  Neurological: He is alert and oriented to person, place, and time. He exhibits normal muscle tone. Coordination normal.  Skin: Skin is warm and dry. No rash noted. No erythema. No pallor.  Psychiatric: He has a normal mood and affect. His behavior is normal. Judgment and thought content normal.    ED Course  Procedures (including critical care  time) Labs Review Labs Reviewed - No data to display Imaging Review Dg Tibia/fibula Left  10/22/2013   CLINICAL DATA:  Initial encounter for injury to the left lower leg.  EXAM: LEFT TIBIA AND FIBULA - 2 VIEW  COMPARISON:  None.  FINDINGS: Mildly displaced oblique fracture involving the proximal fibular metaphysis. No fractures involving the tibia. Visualized knee joint and ankle joint intact.  IMPRESSION: Mildly displaced oblique fracture involving the proximal fibular metaphysis.   Electronically Signed   By: Hulan Saas M.D.   On: 10/22/2013 02:24   Dg Ankle Complete Left  10/22/2013   CLINICAL DATA:  Injury  EXAM: LEFT ANKLE COMPLETE - 3+ VIEW  COMPARISON:  Concomitant radiograph of the left tibia and fibula  FINDINGS: No acute fracture or dislocation is identified. No significant joint effusion. Diffuse soft tissue swelling is present about the ankle. Osseous mineralization is normal. No radiopaque foreign body.  IMPRESSION: Diffuse soft tissue swelling without acute fracture or dislocation.   Electronically Signed   By: Rise Mu M.D.   On: 10/22/2013 02:41   LACERATION REPAIR Performed by: Olivia Mackie Authorized by: Olivia Mackie Consent: Verbal consent obtained. Risks and benefits: risks, benefits and alternatives were discussed Consent given by: patient Patient identity confirmed: provided demographic data Prepped and Draped in normal sterile fashion Wound explored  Laceration Location: Upper lip, left lateral  Laceration Length: 1 cm  No Foreign Bodies seen or palpated  Anesthesia: Nerve block, left infraorbital   Local anesthetic: lidocaine 1 % without epinephrine  Anesthetic total: 3 ml  Irrigation method: syringe Amount of cleaning: standard  Skin closure: 5.0 rapid Vicryl   Number of sutures: 7   Technique: Patient has one vertical mattress suture placed deep to close open wound.  He has 6 running interrupted.  Laceration does not cross the  vermilion border   Patient tolerance: Patient tolerated the procedure well with no immediate complications. EKG Interpretation   None       MDM   1. Fall at home   2. Complicated laceration of lip   3. Sprain of ankle, left   4. Fracture of left proximal fibula    23 year old male status post fall at home with proximal fibula fracture on left and ankle sprain.  Patient placed in posterior splint with stirrups.  He will be discharged on crutches.  Left lip laceration, repaired.  Patient to followup with orthopedics.    Olivia Mackie, MD 10/22/13 [phone]

## 2013-10-22 NOTE — ED Notes (Signed)
Lt. Ankle deformity and full laceration in upper lip. Playing outside and slipped. EMS gave 250 mcq fentanyl, ST 140, BP 176/128; 10/10 pain scale after fentanyl.

## 2013-10-25 ENCOUNTER — Emergency Department (HOSPITAL_COMMUNITY)
Admission: EM | Admit: 2013-10-25 | Discharge: 2013-10-25 | Disposition: A | Discharge status: Home / Self Care | Payer: No Typology Code available for payment source | Attending: Emergency Medicine | Admitting: Emergency Medicine

## 2013-10-25 ENCOUNTER — Encounter (HOSPITAL_COMMUNITY): Payer: Self-pay | Admitting: Emergency Medicine

## 2013-10-25 DIAGNOSIS — S82832A Other fracture of upper and lower end of left fibula, initial encounter for closed fracture: Secondary | ICD-10-CM

## 2013-10-25 DIAGNOSIS — F17200 Nicotine dependence, unspecified, uncomplicated: Secondary | ICD-10-CM | POA: Insufficient documentation

## 2013-10-25 DIAGNOSIS — M79609 Pain in unspecified limb: Secondary | ICD-10-CM | POA: Insufficient documentation

## 2013-10-25 DIAGNOSIS — IMO0002 Reserved for concepts with insufficient information to code with codable children: Secondary | ICD-10-CM | POA: Insufficient documentation

## 2013-10-25 DIAGNOSIS — S93402A Sprain of unspecified ligament of left ankle, initial encounter: Secondary | ICD-10-CM

## 2013-10-25 DIAGNOSIS — M7989 Other specified soft tissue disorders: Secondary | ICD-10-CM | POA: Insufficient documentation

## 2013-10-25 MED ORDER — IBUPROFEN 400 MG PO TABS
800.0000 mg | ORAL_TABLET | Freq: Once | ORAL | Status: AC
  Administered 2013-10-25: 800 mg via ORAL
  Filled 2013-10-25: qty 2

## 2013-10-25 NOTE — ED Provider Notes (Signed)
CSN: [id number]     Arrival date & time 10/25/13  1148 History  This chart was scribed for non-physician practitioner, Sharilyn SitesLisa Avalyn Molino, PA-C,working with Junius ArgyleForrest S Harrison, MD, by Karle PlumberJennifer Tensley, ED Scribe.  This patient was seen in room TR10C/TR10C and the patient's care was started at 12:16 PM.  Chief Complaint  Patient presents with  . foot re-eval    The history is provided by the patient. No language interpreter was used.   HPI Comments:  Jesus [name] is a 23 y.o. male who presents to the Emergency Department complaining of moderate pain for the past two days in his left ankle and foot secondary to a fracture that occurred approximately 3 days ago.  Pt was seen in the ED at that time and dx with a left proximal fibula fx and left ankle sprain. Pt was placed in a posterior splint with stirrups which has remained in place since first visit.  Pt reports increased pain associated numbness and coldness of his toes. He denies any new injury, falls, or trauma.  Pt called 911 to take him to his appt at orthopedics this morning-- once arrived there he was brought to the ED an appt was cancelled.  VS stable on arrival.  History reviewed. No pertinent past medical history. Past Surgical History  Procedure Laterality Date  . Orif mandibular fracture  03/30/2012    Procedure: OPEN REDUCTION INTERNAL FIXATION (ORIF) MANDIBULAR FRACTURE;  Surgeon: Osborn Cohoavid Shoemaker, MD;  [facility];  Service: ENT;  Laterality: N/A;  . Mandibular hardware removal  05/29/2012    Procedure: MANDIBULAR HARDWARE REMOVAL;  Surgeon: Osborn Cohoavid Shoemaker, MD;  Location: [facility];  Service: ENT;  Laterality: N/A;  removal of mmf wires   No family history on file. History  Substance Use Topics  . Smoking status: Current Every Day Smoker    Types: Cigars  . Smokeless tobacco: Never Used  . Alcohol Use: Yes     Comment: Drinks three beers a week    Review of Systems  Musculoskeletal: Positive for  arthralgias (Left foot fracture).  Neurological: Positive for numbness (toes of left foot).  All other systems reviewed and are negative.    Allergies  Review of patient's allergies indicates no known allergies.  Home Medications   Current Outpatient Rx  Name  Route  Sig  Dispense  Refill  . methocarbamol (ROBAXIN-750) 750 MG tablet   Oral   Take 1 tablet (750 mg total) by mouth 4 (four) times daily.   40 tablet   0   . oxyCODONE-acetaminophen (PERCOCET/ROXICET) 5-325 MG per tablet   Oral   Take 2 tablets by mouth every 4 (four) hours as needed for severe pain.   20 tablet   0    Triage Vitals: BP 158/100  Pulse 87  Temp(Src) 98.2 F (36.8 C) (Oral)  Resp 18  SpO2 99%  Physical Exam  Nursing note and vitals reviewed. Constitutional: He is oriented to person, place, and time. He appears well-developed and well-nourished. No distress.  HENT:  Head: Normocephalic and atraumatic.  Mouth/Throat: Oropharynx is clear and moist.  Eyes: Conjunctivae and EOM are normal. Pupils are equal, round, and reactive to light.  Neck: Normal range of motion. Neck supple.  Cardiovascular: Normal rate, regular rhythm and normal heart sounds.   Pulmonary/Chest: Effort normal and breath sounds normal. No respiratory distress. He has no wheezes.  Musculoskeletal:       Right ankle: He exhibits decreased range of motion and  swelling. He exhibits no ecchymosis, no deformity, no laceration and normal pulse. Tenderness. Lateral malleolus tenderness found. Achilles tendon normal.       Feet:  Pt with short leg splint in place; toes are pink and warm to touch with strong cap refill; full sensation is maintained  Neurological: He is alert and oriented to person, place, and time.  Skin: Skin is warm. He is not diaphoretic.  Psychiatric: He has a normal mood and affect.    ED Course  Procedures (including critical care time) DIAGNOSTIC STUDIES: Oxygen Saturation is 99% on RA, normal by my  interpretation.   COORDINATION OF CARE: 12:28 PM- Will have ortho remove splint in order to further examine. Pt verbalizes understanding and agrees to plan.  Medications - No data to display  Labs Review Labs Reviewed - No data to display Imaging Review No results found.  EKG Interpretation   None       MDM   1. Fracture of left proximal fibula   2. Sprain of ankle, left    Pt writhing in pain and unwilling to cooperate with exam initially.  Pt was moved to another room and had splint fully removed.  LE compartment is soft and i have no concerns for compartment syndrome at this time.  Left ankle remains swollen along lateral margins without gross deformity.  Pt states he cannot feel or move his toes however when skin on toes is pinched, he pulls them away and wiggles them whenever i touch the sole of his foot.  LLE is NVI with a strong pulse and cap refill.  Left leg was  in new short leg splint.  Pt will be discharged and advised to continue taking pain medications given at prior visit. He is again strongly advised to FU with orthopedics.  Discussed plan with pt, they agreed.  Return precautions advised.  I personally performed the services described in this documentation, which was scribed in my presence. The recorded information has been reviewed and is accurate.    Garlon Hatchet, PA-C 10/25/13 [phone]

## 2013-10-25 NOTE — ED Notes (Signed)
Pt tearful. Would not let me touch his toes but they arm pink and warm to touch.

## 2013-10-25 NOTE — Discharge Instructions (Signed)
Continue taking pain medications as directed. Follow up with orthopedics as previously instructed. Return to the ED for new or worsening symptoms.

## 2013-10-25 NOTE — ED Notes (Signed)
Pt was tx here for L ankle fracture on 1/16.  Pt states yesterday the pain to his L ankle and toes began increasing even though he has been propping his feet up, etc.  Toes are numb, cold to touch (even in the house), though good cap refill.  Pt had a referral to the ortho doc this afternoon but he does not have ins and the money to give up front for the eval.

## 2013-10-26 NOTE — ED Provider Notes (Signed)
Medical screening examination/treatment/procedure(s) were performed by non-physician practitioner and as supervising physician I was immediately available for consultation/collaboration.  EKG Interpretation   None         Carlon Chaloux S Sahej Schrieber, MD 10/26/13 1022 

## 2013-12-01 ENCOUNTER — Emergency Department (HOSPITAL_COMMUNITY)
Admission: EM | Admit: 2013-12-01 | Discharge: 2013-12-01 | Disposition: A | Discharge status: Home / Self Care | Payer: Self-pay | Attending: Emergency Medicine | Admitting: Emergency Medicine

## 2013-12-01 ENCOUNTER — Encounter (HOSPITAL_COMMUNITY): Payer: Self-pay | Admitting: Emergency Medicine

## 2013-12-01 ENCOUNTER — Emergency Department (HOSPITAL_COMMUNITY): Payer: Self-pay

## 2013-12-01 DIAGNOSIS — G8911 Acute pain due to trauma: Secondary | ICD-10-CM | POA: Insufficient documentation

## 2013-12-01 DIAGNOSIS — M25579 Pain in unspecified ankle and joints of unspecified foot: Secondary | ICD-10-CM | POA: Insufficient documentation

## 2013-12-01 DIAGNOSIS — Z87891 Personal history of nicotine dependence: Secondary | ICD-10-CM | POA: Insufficient documentation

## 2013-12-01 DIAGNOSIS — M25572 Pain in left ankle and joints of left foot: Secondary | ICD-10-CM

## 2013-12-01 MED ORDER — TRAMADOL HCL 50 MG PO TABS: 50.0000 mg | ORAL_TABLET | Freq: Four times a day (QID) | ORAL | Status: DC | PRN

## 2013-12-01 MED ORDER — METHOCARBAMOL 500 MG PO TABS: 500.0000 mg | ORAL_TABLET | Freq: Two times a day (BID) | ORAL | Status: DC

## 2013-12-01 MED ORDER — KETOROLAC TROMETHAMINE 60 MG/2ML IM SOLN
60.0000 mg | Freq: Once | INTRAMUSCULAR | Status: AC
  Administered 2013-12-01: 60 mg via INTRAMUSCULAR
  Filled 2013-12-01: qty 2

## 2013-12-01 NOTE — ED Notes (Signed)
Patient transported to X-ray 

## 2013-12-01 NOTE — ED Provider Notes (Signed)
CSN: [id number]     Arrival date & time 12/01/13  4098 History  This chart was scribed for non-physician practitioner, Lowella Dell, PA-C working with Gerhard Munch, MD by Luisa Dago, ED scribe. This patient was seen in room TR07C/TR07C and the patient's care was started at 9:34 PM.    Chief Complaint  Patient presents with  . Ankle Pain    The history is provided by the patient. No language interpreter was used.   HPI Comments Jesus [name] is a 23 y.o. male who presents to the Emergency Department complaining of worsening left ankle pain that started about 1 hours ago. Pt states that he fractured his left ankle about 1 month ago. He says that was playing football outside and he slipped and fell. He recalls rolling his ankle inwards. Pt states that every time he goes to see the doctor no one can tell him what is wrong with his ankle. He describes the pain as throbbing and rates it as a 10/10. Pt reports taking muscle relaxer and hydrocodone, with minimal relief of his ankle pain. Pt is requesting to get another X-ray of his left ankle. He denies any fever, chills, cough, SOB, or congestion.   Pt has an orthopedic appointment December 21, 2013 at 5 pm.    History reviewed. No pertinent past medical history. Past Surgical History  Procedure Laterality Date  . Orif mandibular fracture  03/30/2012    Procedure: OPEN REDUCTION INTERNAL FIXATION (ORIF) MANDIBULAR FRACTURE;  Surgeon: Osborn Coho, MD;  Location: [facility] OR;  Service: ENT;  Laterality: N/A;  . Mandibular hardware removal  05/29/2012    Procedure: MANDIBULAR HARDWARE REMOVAL;  Surgeon: Osborn Coho, MD;  Location: [facility];  Service: ENT;  Laterality: N/A;  removal of mmf wires   History reviewed. No pertinent family history. History  Substance Use Topics  . Smoking status: Former Smoker    Types: Cigars  . Smokeless tobacco: Never Used     Comment: quit 2013  . Alcohol Use: Yes     Comment: Drinks  three beers a week    Review of Systems  Musculoskeletal: Positive for arthralgias (left ankle).  All other systems reviewed and are negative.      Allergies  Review of patient's allergies indicates no known allergies.  Home Medications   Current Outpatient Rx  Name  Route  Sig  Dispense  Refill  . HYDROcodone-acetaminophen (NORCO/VICODIN) 5-325 MG per tablet   Oral   Take 1 tablet by mouth every 6 (six) hours as needed for moderate pain.         . methocarbamol (ROBAXIN-750) 750 MG tablet   Oral   Take 1 tablet (750 mg total) by mouth 4 (four) times daily.   40 tablet   0   . methocarbamol (ROBAXIN) 500 MG tablet   Oral   Take 1 tablet (500 mg total) by mouth 2 (two) times daily.   20 tablet   0   . traMADol (ULTRAM) 50 MG tablet   Oral   Take 1 tablet (50 mg total) by mouth every 6 (six) hours as needed.   15 tablet   0    BP 138/98  Pulse 88  Temp(Src) 98.3 F (36.8 C) (Oral)  Resp 18  SpO2 93%  Physical Exam  Nursing note and vitals reviewed. Constitutional: He is oriented to person, place, and time. He appears well-developed and well-nourished. No distress.  HENT:  Head: Normocephalic and atraumatic.  Eyes: Conjunctivae  and EOM are normal.  Neck: Neck supple. No JVD present. No tracheal deviation present.  Cardiovascular: Normal rate, regular rhythm and intact distal pulses.  Exam reveals no gallop and no friction rub.   No murmur heard. Pulmonary/Chest: Effort normal. No respiratory distress. He has no wheezes. He has no rhonchi. He has no rales.  Musculoskeletal: Normal range of motion. He exhibits no edema.       Left ankle: He exhibits swelling and ecchymosis. He exhibits no deformity. Tenderness. Medial malleolus tenderness found. Achilles tendon normal.       Feet:  Left ankle: Pedal pulse intact. Posterior Tibial pulse intact. Good capillary refill. Range of motion limited secondary to pain. Pt is able to wiggle his toes. Good sensation.    No bony tenderness of Distal tibia, fibula, or forefoot.   Pain illicited with inversion of Left ankle.  Neurological: He is alert and oriented to person, place, and time.  Skin: Skin is warm and dry. He is not diaphoretic.  Mild ecchymosis to his left ankle.   Psychiatric: He has a normal mood and affect. His behavior is normal.    ED Course  Procedures (including critical care time)  DIAGNOSTIC STUDIES: Oxygen Saturation is 93% on RA, low by my interpretation.    COORDINATION OF CARE: 9:49 PM-  Pt advised of plan for treatment and pt agrees. Medications  ketorolac (TORADOL) injection 60 mg (60 mg Intramuscular Given 12/01/13 2153)   Labs Review Labs Reviewed - No data to display Imaging Review Dg Ankle Complete Left  12/01/2013   CLINICAL DATA:  Left ankle injury and pain.  EXAM: LEFT ANKLE COMPLETE - 3+ VIEW  COMPARISON:  None  FINDINGS: A tiny bony density along the posterior malleolus is identified and tiny fracture is not excluded although this could be related to prior injury.  No other acute fracture, subluxation or dislocation identified.  No focal bony lesions are identified.  IMPRESSION: Tiny bony density along the posterior malleolus of uncertain chronicity. This may represent a small acute fracture or evidence of previous injury. Correlate with pain.  No other acute bony abnormalities identified.   Electronically Signed   By: Laveda AbbeJeff  Hu M.D.   On: 12/01/2013 22:28    EKG Interpretation   None       MDM   Final diagnoses:  Ankle pain, left   Plain film shows tiny bone density along posterior malleolus suggestive of his recent injury. Patient currently in walking boot and on crutches.  Plan to treat pain and have patient follow up with Ortho. Patient agrees with this plan. Discharged in good condition.   Meds given in ED:  Medications  ketorolac (TORADOL) injection 60 mg (60 mg Intramuscular Given 12/01/13 2153)    Discharge Medication List as of 12/01/2013  11:21 PM    START taking these medications   Details  traMADol (ULTRAM) 50 MG tablet Take 1 tablet (50 mg total) by mouth every 6 (six) hours as needed., Starting 12/01/2013, Until Discontinued, Print        I personally performed the services described in this documentation, which was scribed in my presence. The recorded information has been reviewed and is accurate.    Allen NorrisJacob Gray CuneyLackey, PA-C 12/02/13 [phone]

## 2013-12-01 NOTE — ED Notes (Signed)
Pt reports L ankle and back of foot pain about an hour ago; reports broke it about a month ago; pt has fx boot

## 2013-12-01 NOTE — Discharge Instructions (Signed)
Follow up with orthopedics as listed above in "Follow up" section. Take pain medication as directed. Return to the Emergency Department if you develop any leg weakness, numbness, or change in color/temperature. Rest ice and elevate affected ankle.    Ankle Pain Ankle pain is a common symptom. The bones, cartilage, tendons, and muscles of the ankle joint perform a lot of work each day. The ankle joint holds your body weight and allows you to move around. Ankle pain can occur on either side or back of 1 or both ankles. Ankle pain may be sharp and burning or dull and aching. There may be tenderness, stiffness, redness, or warmth around the ankle. The pain occurs more often when a person walks or puts pressure on the ankle. CAUSES  There are many reasons ankle pain can develop. It is important to work with your caregiver to identify the cause since many conditions can impact the bones, cartilage, muscles, and tendons. Causes for ankle pain include:  Injury, including a break (fracture), sprain, or strain often due to a fall, sports, or a high-impact activity.  Swelling (inflammation) of a tendon (tendonitis).  Achilles tendon rupture.  Ankle instability after repeated sprains and strains.  Poor foot alignment.  Pressure on a nerve (tarsal tunnel syndrome).  Arthritis in the ankle or the lining of the ankle.  Crystal formation in the ankle (gout or pseudogout). DIAGNOSIS  A diagnosis is based on your medical history, your symptoms, results of your physical exam, and results of diagnostic tests. Diagnostic tests may include X-ray exams or a computerized magnetic scan (magnetic resonance imaging, MRI). TREATMENT  Treatment will depend on the cause of your ankle pain and may include:  Keeping pressure off the ankle and limiting activities.  Using crutches or other walking support (a cane or brace).  Using rest, ice, compression, and elevation.  Participating in physical therapy or home  exercises.  Wearing shoe inserts or special shoes.  Losing weight.  Taking medications to reduce pain or swelling or receiving an injection.  Undergoing surgery. HOME CARE INSTRUCTIONS   Only take over-the-counter or prescription medicines for pain, discomfort, or fever as directed by your caregiver.  Put ice on the injured area.  Put ice in a plastic bag.  Place a towel between your skin and the bag.  Leave the ice on for 15-20 minutes at a time, 03-04 times a day.  Keep your leg raised (elevated) when possible to lessen swelling.  Avoid activities that cause ankle pain.  Follow specific exercises as directed by your caregiver.  Record how often you have ankle pain, the location of the pain, and what it feels like. This information may be helpful to you and your caregiver.  Ask your caregiver about returning to work or sports and whether you should drive.  Follow up with your caregiver for further examination, therapy, or testing as directed. SEEK MEDICAL CARE IF:   Pain or swelling continues or worsens beyond 1 week.  You have an oral temperature above 102 F (38.9 C).  You are feeling unwell or have chills.  You are having an increasingly difficult time with walking.  You have loss of sensation or other new symptoms.  You have questions or concerns. MAKE SURE YOU:   Understand these instructions.  Will watch your condition.  Will get help right away if you are not doing well or get worse. Document Released: 03/13/2010 Document Revised: 12/16/2011 Document Reviewed: 03/13/2010 [facility] Patient Information 2014 [place], [place].

## 2013-12-02 NOTE — ED Provider Notes (Signed)
Medical screening examination/treatment/procedure(s) were performed by non-physician practitioner and as supervising physician I was immediately available for consultation/collaboration.  Holy Battenfield, MD 12/02/13 1828   Medical screening examination/treatment/procedure(s) were performed by non-physician practitioner and as supervising physician I was immediately available for consultation/collaboration.      Gerhard Munch, MD 12/02/13 [phone]

## 2014-02-08 ENCOUNTER — Ambulatory Visit: Payer: Self-pay | Attending: Orthopedic Surgery

## 2014-03-02 ENCOUNTER — Ambulatory Visit: Payer: Self-pay | Admitting: Physical Therapy

## 2014-03-23 ENCOUNTER — Emergency Department (HOSPITAL_COMMUNITY)
Admission: EM | Admit: 2014-03-23 | Discharge: 2014-03-23 | Disposition: A | Discharge status: Home / Self Care | Payer: Self-pay | Attending: Emergency Medicine | Admitting: Emergency Medicine

## 2014-03-23 ENCOUNTER — Encounter (HOSPITAL_COMMUNITY): Payer: Self-pay | Admitting: Emergency Medicine

## 2014-03-23 ENCOUNTER — Emergency Department (HOSPITAL_COMMUNITY): Payer: Self-pay

## 2014-03-23 DIAGNOSIS — Z79899 Other long term (current) drug therapy: Secondary | ICD-10-CM | POA: Insufficient documentation

## 2014-03-23 DIAGNOSIS — Y929 Unspecified place or not applicable: Secondary | ICD-10-CM | POA: Insufficient documentation

## 2014-03-23 DIAGNOSIS — S99929A Unspecified injury of unspecified foot, initial encounter: Principal | ICD-10-CM

## 2014-03-23 DIAGNOSIS — W2209XA Striking against other stationary object, initial encounter: Secondary | ICD-10-CM | POA: Insufficient documentation

## 2014-03-23 DIAGNOSIS — S99919A Unspecified injury of unspecified ankle, initial encounter: Principal | ICD-10-CM

## 2014-03-23 DIAGNOSIS — Z87891 Personal history of nicotine dependence: Secondary | ICD-10-CM | POA: Insufficient documentation

## 2014-03-23 DIAGNOSIS — Y9383 Activity, rough housing and horseplay: Secondary | ICD-10-CM | POA: Insufficient documentation

## 2014-03-23 DIAGNOSIS — S8990XA Unspecified injury of unspecified lower leg, initial encounter: Secondary | ICD-10-CM | POA: Insufficient documentation

## 2014-03-23 DIAGNOSIS — Z9889 Other specified postprocedural states: Secondary | ICD-10-CM | POA: Insufficient documentation

## 2014-03-23 DIAGNOSIS — S46909A Unspecified injury of unspecified muscle, fascia and tendon at shoulder and upper arm level, unspecified arm, initial encounter: Secondary | ICD-10-CM | POA: Insufficient documentation

## 2014-03-23 DIAGNOSIS — S4980XA Other specified injuries of shoulder and upper arm, unspecified arm, initial encounter: Secondary | ICD-10-CM | POA: Insufficient documentation

## 2014-03-23 DIAGNOSIS — M25572 Pain in left ankle and joints of left foot: Secondary | ICD-10-CM

## 2014-03-23 DIAGNOSIS — M25512 Pain in left shoulder: Secondary | ICD-10-CM

## 2014-03-23 MED ORDER — CYCLOBENZAPRINE HCL 10 MG PO TABS: 10.0000 mg | ORAL_TABLET | Freq: Two times a day (BID) | ORAL | Status: DC | PRN

## 2014-03-23 MED ORDER — TRAMADOL HCL 50 MG PO TABS: 50.0000 mg | ORAL_TABLET | Freq: Four times a day (QID) | ORAL | Status: AC | PRN

## 2014-03-23 NOTE — ED Notes (Signed)
Pt states that he has had lt ankle pain since an accident in January.  States that it got worse on Thursday because he was play fighting with some friends.  Also c/o lt shoulder pain.

## 2014-03-23 NOTE — ED Provider Notes (Signed)
CSN: [id number]     Arrival date & time 03/23/14  1414 History  This chart was scribed for Emilia BeckKaitlyn Szekalski, PA, working with Ward GivensIva L Knapp, MD, by Bronson CurbJacqueline Melvin, ED Scribe. This patient was seen in room WTR8/WTR8 and the patient's care was started at 4:58 PM.    Chief Complaint  Patient presents with  . Shoulder Pain  . Ankle Pain     Patient is a 23 y.o. male presenting with shoulder pain and ankle pain. The history is provided by the patient. No language interpreter was used.  Shoulder Pain This is a new problem. The current episode started more than 2 days ago. The problem has not changed since onset.Pertinent negatives include no chest pain, no abdominal pain, no headaches and no shortness of breath.  Ankle Pain Location:  Ankle Time since incident:  5 months Injury: yes   Mechanism of injury comment:  Playing football Ankle location:  L ankle Pain details:    Quality:  Aching   Radiates to:  Does not radiate   Severity:  Moderate   Progression:  Unchanged Dislocation: no   Associated symptoms: no back pain, no fatigue, no fever, no muscle weakness, no numbness, no stiffness and no swelling     HPI Comments: Jesus [name] is a 23 y.o. male who presents to the Emergency Department complaining of left ankle pain that began 5 months ago when he injured it while playing football. Patient reports history fracture in same ankle. He denies any new injuries or trauma to the ankle. Patient has not taken anything for pain. He denies numbness or tingling. Patient has an follow up appointment with his orthopedic, Dr. Wallace CullensGray, in 2 months.   Patient also complains of left shoulder pain that began 6 days ago. Patient states he was roughhousing with some friends when his left shoulder hit the corner of a wall. He reports the pain is worse with movement. Patient has not taken anything for the pain. He denies numbness, tingling, or  history of shoulder pain.   History reviewed. No pertinent  past medical history. Past Surgical History  Procedure Laterality Date  . Orif mandibular fracture  03/30/2012    Procedure: OPEN REDUCTION INTERNAL FIXATION (ORIF) MANDIBULAR FRACTURE;  Surgeon: Osborn Cohoavid Shoemaker, MD;  [facility];  Service: ENT;  Laterality: N/A;  . Mandibular hardware removal  05/29/2012    Procedure: MANDIBULAR HARDWARE REMOVAL;  Surgeon: Osborn Cohoavid Shoemaker, MD;  Location: [facility];  Service: ENT;  Laterality: N/A;  removal of mmf wires   No family history on file. History  Substance Use Topics  . Smoking status: Former Smoker    Types: Cigars  . Smokeless tobacco: Never Used     Comment: quit 2013  . Alcohol Use: Yes     Comment: Drinks three beers a week    Review of Systems  Constitutional: Negative for fever and fatigue.  Respiratory: Negative for shortness of breath.   Cardiovascular: Negative for chest pain.  Gastrointestinal: Negative for abdominal pain.  Musculoskeletal: Negative for back pain and stiffness.       Left shoulder and left ankle pain  Neurological: Negative for headaches.      Allergies  Review of patient's allergies indicates no known allergies.  Home Medications   Prior to Admission medications   Medication Sig Start Date End Date Taking? Authorizing Provider  HYDROcodone-acetaminophen (NORCO/VICODIN) 5-325 MG per tablet Take 1 tablet by mouth every 6 (six) hours as needed for moderate  pain.    Historical Provider, MD  methocarbamol (ROBAXIN) 500 MG tablet Take 1 tablet (500 mg total) by mouth 2 (two) times daily. 12/01/13   Rudene AndaJacob Gray Lackey, PA-C  methocarbamol (ROBAXIN-750) 750 MG tablet Take 1 tablet (750 mg total) by mouth 4 (four) times daily. 10/22/13   Olivia Mackielga M Otter, MD  traMADol (ULTRAM) 50 MG tablet Take 1 tablet (50 mg total) by mouth every 6 (six) hours as needed. 12/01/13   Rudene AndaJacob Gray Lackey, PA-C   Triage Vitals: BP 145/85  Pulse 81  Temp(Src) 98.1 F (36.7 C) (Oral)  Resp 18  SpO2 100%  Physical  Exam  Nursing note and vitals reviewed. Constitutional: He is oriented to person, place, and time. He appears well-developed and well-nourished. No distress.  HENT:  Head: Normocephalic and atraumatic.  Eyes: Conjunctivae and EOM are normal.  Neck: Neck supple. No tracheal deviation present.  Cardiovascular: Normal rate.   Pulmonary/Chest: Effort normal. No respiratory distress.  Musculoskeletal: Normal range of motion.  Neurological: He is alert and oriented to person, place, and time.  Skin: Skin is warm and dry.  Psychiatric: He has a normal mood and affect. His behavior is normal.    ED Course  Procedures (including critical care time)  DIAGNOSTIC STUDIES: Oxygen Saturation is 100% on room air, normal by my interpretation.    COORDINATION OF CARE: At [id number] Discussed treatment plan with patient which includes tramadol and Flexeril. Patient agrees.   Labs Review Labs Reviewed - No data to display  Imaging Review Dg Ankle Complete Left  03/23/2014   CLINICAL DATA:  Left ankle injury 1 week ago  EXAM: LEFT ANKLE COMPLETE - 3+ VIEW  COMPARISON:  12/01/2013  FINDINGS: Three views of left ankle submitted. No acute fracture or subluxation. Ankle mortise is preserved. Stable probable old injury medial malleolus.  IMPRESSION: Negative.   Electronically Signed   By: Natasha MeadLiviu  Pop M.D.   On: 03/23/2014 15:39   Dg Shoulder Left  03/23/2014   CLINICAL DATA:  Injury 1 week ago  EXAM: LEFT SHOULDER - 2+ VIEW  COMPARISON:  None.  FINDINGS: Three views of left shoulder submitted. No acute fracture or subluxation. No radiopaque foreign body.  IMPRESSION: Negative.   Electronically Signed   By: Natasha MeadLiviu  Pop M.D.   On: 03/23/2014 15:39     EKG Interpretation None      MDM   Final diagnoses:  Left shoulder pain  Left ankle pain   Patient's xrays unremarkable for acute changes. Patient will have tramadol and flexeril for pain. Patient advised to follow up with his orthopedic doctor.   I  personally performed the services described in this documentation, which was scribed in my presence. The recorded information has been reviewed and is accurate.    Emilia BeckKaitlyn Szekalski, PA-C 03/23/14 1821

## 2014-03-23 NOTE — Discharge Instructions (Signed)
Take Tramadol as needed for pain. Take Flexeril as needed for muscle spasm. Refer to attached documents for more information. Follow up with your orthopedic doctor for further evaluation.

## 2014-03-23 NOTE — ED Provider Notes (Signed)
Medical screening examination/treatment/procedure(s) were performed by non-physician practitioner and as supervising physician I was immediately available for consultation/collaboration.   EKG Interpretation None      Iva Knapp, MD, FACEP   Iva L Knapp, MD 03/23/14 2210 

## 2014-05-02 ENCOUNTER — Encounter (HOSPITAL_COMMUNITY): Payer: Self-pay | Admitting: Emergency Medicine

## 2014-05-02 ENCOUNTER — Emergency Department (HOSPITAL_COMMUNITY)
Admission: EM | Admit: 2014-05-02 | Discharge: 2014-05-02 | Disposition: A | Discharge status: Home / Self Care | Payer: No Typology Code available for payment source | Attending: Emergency Medicine | Admitting: Emergency Medicine

## 2014-05-02 DIAGNOSIS — M79609 Pain in unspecified limb: Secondary | ICD-10-CM | POA: Insufficient documentation

## 2014-05-02 DIAGNOSIS — M79672 Pain in left foot: Secondary | ICD-10-CM

## 2014-05-02 DIAGNOSIS — R071 Chest pain on breathing: Secondary | ICD-10-CM | POA: Insufficient documentation

## 2014-05-02 DIAGNOSIS — G8929 Other chronic pain: Secondary | ICD-10-CM | POA: Insufficient documentation

## 2014-05-02 DIAGNOSIS — R0789 Other chest pain: Secondary | ICD-10-CM

## 2014-05-02 DIAGNOSIS — F17200 Nicotine dependence, unspecified, uncomplicated: Secondary | ICD-10-CM | POA: Insufficient documentation

## 2014-05-02 DIAGNOSIS — Z791 Long term (current) use of non-steroidal anti-inflammatories (NSAID): Secondary | ICD-10-CM | POA: Insufficient documentation

## 2014-05-02 DIAGNOSIS — Z79899 Other long term (current) drug therapy: Secondary | ICD-10-CM | POA: Insufficient documentation

## 2014-05-02 DIAGNOSIS — Z8781 Personal history of (healed) traumatic fracture: Secondary | ICD-10-CM | POA: Insufficient documentation

## 2014-05-02 MED ORDER — IBUPROFEN 400 MG PO TABS
800.0000 mg | ORAL_TABLET | Freq: Once | ORAL | Status: AC
  Administered 2014-05-02: 800 mg via ORAL
  Filled 2014-05-02: qty 2

## 2014-05-02 MED ORDER — IBUPROFEN 800 MG PO TABS: 800.0000 mg | ORAL_TABLET | Freq: Three times a day (TID) | ORAL | Status: AC

## 2014-05-02 NOTE — ED Notes (Signed)
Declined W/C at D/C and was escorted to lobby by RN. 

## 2014-05-02 NOTE — ED Provider Notes (Signed)
CSN: [id number]     Arrival date & time 05/02/14  1543 History  This chart was scribed for non-physician provider Dierdre ForthHannah Lucas Exline, PA-C, working with Hurman HornJohn M Bednar, MD by Phillis HaggisGabriella Gaje, ED Scribe. This patient was seen in room TR07C/TR07C and patient care was started at 4:41 PM.    Chief Complaint  Patient presents with  . Skin Problem  . Foot Pain   The history is provided by the patient and medical records. No language interpreter was used.   HPI Comments: Jesus [name] is a 23 y.o. male who presents to the Emergency Department complaining of left foot and ankle pain onset 7 months ago after a fracture.Marland Kitchen. He reports that the pain has been occurring since a fracture in January. He reports that he has been unable to move his toes properly. He states that he has seen an orthopedist and his next appointment is next month. He also reports that he has been going to physical therapy for his foot but is only making slow progress. He states that he has a knot on his left chest onset two days ago with associated pain worse with movement and palpation. He denies taking anything for the pain. He denies playing sports or lifting recently. No known chest injuries.   History reviewed. No pertinent past medical history. Past Surgical History  Procedure Laterality Date  . Orif mandibular fracture  03/30/2012    Procedure: OPEN REDUCTION INTERNAL FIXATION (ORIF) MANDIBULAR FRACTURE;  Surgeon: Osborn Cohoavid Shoemaker, MD;  [facility];  Service: ENT;  Laterality: N/A;  . Mandibular hardware removal  05/29/2012    Procedure: MANDIBULAR HARDWARE REMOVAL;  Surgeon: Osborn Cohoavid Shoemaker, MD;  Location: [facility];  Service: ENT;  Laterality: N/A;  removal of mmf wires   History reviewed. No pertinent family history. History  Substance Use Topics  . Smoking status: Current Some Day Smoker    Types: Cigars, Cigarettes  . Smokeless tobacco: Never Used     Comment: quit 2013  . Alcohol Use: Yes      Comment: Drinks three beers a week    Review of Systems  Constitutional: Negative for fever, diaphoresis, appetite change, fatigue and unexpected weight change.  HENT: Negative for mouth sores.   Respiratory: Negative for cough, chest tightness, shortness of breath and wheezing.   Cardiovascular: Positive for chest pain.  Gastrointestinal: Negative for abdominal pain.  Genitourinary: Negative for urgency.  Musculoskeletal: Positive for arthralgias. Negative for back pain and neck stiffness.  Skin: Negative for rash.  Allergic/Immunologic: Negative for immunocompromised state.  Neurological: Negative for syncope, light-headedness and headaches.  Hematological: Does not bruise/bleed easily.  Psychiatric/Behavioral: Negative for sleep disturbance.   Allergies  Tramadol  Home Medications   Prior to Admission medications   Medication Sig Start Date End Date Taking? Authorizing Provider  cyclobenzaprine (FLEXERIL) 10 MG tablet Take 1 tablet (10 mg total) by mouth 2 (two) times daily as needed for muscle spasms. 03/23/14   Emilia BeckKaitlyn Szekalski, PA-C  HYDROcodone-acetaminophen (NORCO/VICODIN) 5-325 MG per tablet Take 1 tablet by mouth every 6 (six) hours as needed for moderate pain.    Historical Provider, MD  ibuprofen (ADVIL,MOTRIN) 800 MG tablet Take 1 tablet (800 mg total) by mouth 3 (three) times daily. 05/02/14   Areona Homer, PA-C  methocarbamol (ROBAXIN) 500 MG tablet Take 1 tablet (500 mg total) by mouth 2 (two) times daily. 12/01/13   Rudene AndaJacob Gray Lackey, PA-C  methocarbamol (ROBAXIN-750) 750 MG tablet Take 1 tablet (750 mg total)  by mouth 4 (four) times daily. 10/22/13   Olivia Mackie, MD  traMADol (ULTRAM) 50 MG tablet Take 1 tablet (50 mg total) by mouth every 6 (six) hours as needed. 12/01/13   Rudene Anda, PA-C  traMADol (ULTRAM) 50 MG tablet Take 1 tablet (50 mg total) by mouth every 6 (six) hours as needed. 03/23/14   Kaitlyn Szekalski, PA-C   BP 125/99  Pulse 77   Temp(Src) 98.5 F (36.9 C) (Oral)  Ht 5\' 10"  (1.778 m)  Wt 145 lb (65.772 kg)  BMI 20.81 kg/m2  SpO2 100% Physical Exam  Nursing note and vitals reviewed. Constitutional: He appears well-developed and well-nourished. No distress.  HENT:  Head: Normocephalic and atraumatic.  Eyes: Conjunctivae are normal.  Neck: Normal range of motion.  Cardiovascular: Normal rate, regular rhythm, normal heart sounds and intact distal pulses.   No murmur heard. Capillary refill < 3 secs.   Pulmonary/Chest: Effort normal and breath sounds normal.  Has tenderness to palpation on junction of 5th rib with sternum. No erythema, induration or swelling. No palpable mass. Clear and equal breath sounds.   Musculoskeletal: He exhibits tenderness. He exhibits no edema.  ROM: Full of left knee without pain, left ankle and toes with pain. No swelling, ecchymoses, induration, or evidence of new injury.   Neurological: He is alert. Coordination normal.  Sensation intact to dull and sharp.  Strength 5/5 dorsi flexion and plantar flexion in right foot and 3/5 dorsi flexion and plantar flexion due to pain and poor effort.   Skin: Skin is warm and dry. He is not diaphoretic.  No tenting of the skin  Psychiatric: He has a normal mood and affect.    ED Course  Procedures (including critical care time) DIAGNOSTIC STUDIES: Oxygen Saturation is 100% on room air, normal by my interpretation.    COORDINATION OF CARE: 4:44 PM-Discussed treatment plan which includes anti-inflammatories with pt at bedside and pt agreed to plan.   Labs Review Labs Reviewed - No data to display  Imaging Review No results found.   EKG Interpretation None      MDM   Final diagnoses:  Chronic foot pain, left  Chest wall pain   Jesus [name] presents with MSK chest pain and chronic left ankle pain.  Record review shows the patient has been seen numerous times in the last 6 months for persistent pain in his left ankle. No new  injury today. He reports decreased movement of his toes however has full movement with painful recoil.  Patient also with chest wall pain specifically over the joint of the left fifth rib and the sternum. No mass palpated here.  Patient without risk for ACS and pain is worse with palpation and movement, pinpoint in nature.  Patient will be given anti-inflammatories for this. Clinical breast sounds are regular rate and rhythm. I doubt bronchitis, pneumonia.    I have personally reviewed patient's vitals, nursing note and any pertinent labs or imaging.  I performed an undressed physical exam.    At this time, it has been determined that no acute conditions requiring further emergency intervention. The patient/guardian have been advised of the diagnosis and plan. I reviewed all labs and imaging including any potential incidental findings. We have discussed signs and symptoms that warrant return to the ED, such as difficulty breathing, worsening pain.  Patient/guardian has voiced understanding and agreed to follow-up with the PCP or specialist at his next scheduled physical therapy and followup orthopedic appointment.  Vital signs are stable at discharge.   BP 125/99  Pulse 77  Temp(Src) 98.5 F (36.9 C) (Oral)  Ht 5\' 10"  (1.778 m)  Wt 145 lb (65.772 kg)  BMI 20.81 kg/m2  SpO2 100%        I personally performed the services described in this documentation, which was scribed in my presence. The recorded information has been reviewed and is accurate.      Dahlia Client Mikenna Bunkley, PA-C 05/02/14 1709

## 2014-05-02 NOTE — ED Notes (Signed)
He states hes had L foot and ankle pain since a fracture in January. He states he followed up with ortho MD for the injury "but they didn't do anything for me." he also states he can feel a "painful lump" on his chest this week

## 2014-05-02 NOTE — Discharge Instructions (Signed)
1. Medications: ibuprofen, usual home medications 2. Treatment: rest, drink plenty of fluids, continue to physical therapy exercises 3. Follow Up: Please followup with your orthopedist at your next appointment for discussion of your diagnoses and further evaluation after today's visit; if you do not have a primary care doctor use the resource guide provided to find one;    Chest Wall Pain Chest wall pain is pain in or around the bones and muscles of your chest. It may take up to 6 weeks to get better. It may take longer if you must stay physically active in your work and activities.  CAUSES  Chest wall pain may happen on its own. However, it may be caused by:  A viral illness like the flu.  Injury.  Coughing.  Exercise.  Arthritis.  Fibromyalgia.  Shingles. HOME CARE INSTRUCTIONS   Avoid overtiring physical activity. Try not to strain or perform activities that cause pain. This includes any activities using your chest or your abdominal and side muscles, especially if heavy weights are used.  Put ice on the sore area.  Put ice in a plastic bag.  Place a towel between your skin and the bag.  Leave the ice on for 15-20 minutes per hour while awake for the first 2 days.  Only take over-the-counter or prescription medicines for pain, discomfort, or fever as directed by your caregiver. SEEK IMMEDIATE MEDICAL CARE IF:   Your pain increases, or you are very uncomfortable.  You have a fever.  Your chest pain becomes worse.  You have new, unexplained symptoms.  You have nausea or vomiting.  You feel sweaty or lightheaded.  You have a cough with phlegm (sputum), or you cough up blood. MAKE SURE YOU:   Understand these instructions.  Will watch your condition.  Will get help right away if you are not doing well or get worse. Document Released: 09/23/2005 Document Revised: 12/16/2011 Document Reviewed: 05/20/2011 [facility] Patient Information 2015 [place], [place]. This  information is not intended to replace advice given to you by your health care provider. Make sure you discuss any questions you have with your health care provider.    Emergency Department Resource Guide 1) Find a Doctor and Pay Out of Pocket Although you won't have to find out who is covered by your insurance plan, it is a good idea to ask around and get recommendations. You will then need to call the office and see if the doctor you have chosen will accept you as a new patient and what types of options they offer for patients who are self-pay. Some doctors offer discounts or will set up payment plans for their patients who do not have insurance, but you will need to ask so you aren't surprised when you get to your appointment.  2) Contact Your Local Health Department Not all health departments have doctors that can see patients for sick visits, but many do, so it is worth a call to see if yours does. If you don't know where your local health department is, you can check in your phone book. The CDC also has a tool to help you locate your state's health department, and many state websites also have listings of all of their local health departments.  3) Find a Walk-in Clinic If your illness is not likely to be very severe or complicated, you may want to try a walk in clinic. These are popping up all over the country in pharmacies, drugstores, and shopping centers. They're usually staffed by  nurse practitioners or physician assistants that have been trained to treat common illnesses and complaints. They're usually fairly quick and inexpensive. However, if you have serious medical issues or chronic medical problems, these are probably not your best option.  No Primary Care Doctor: - Call Health Connect at  [phone] - they can help you locate a primary care doctor that  accepts your insurance, provides certain services, etc. - Physician Referral Service- [phone]  Chronic Pain  Problems: Organization         Address  Phone   Notes  [facility]  [phone] Patients need to be referred by their primary care doctor.   Medication Assistance: Organization         Address  Phone   Notes  [facility] Medication [facility] [address] [place], [place] [address] [phone] --Must be a resident of [facility] -- Must have NO insurance coverage whatsoever (no Medicaid/ Medicare, etc.) -- The pt. MUST have a primary care doctor that directs their care regularly and follows them in the community   [facility]ist  [phone]   [employer]  [phone]    Agencies that provide inexpensive medical care: Organization         Address  Phone   Notes  [place]  [phone]   [facility]    [phone]   [facility] [place], [place] [address] [phone]   [place] [address], [place] [phone]   Planned Parenthood    [phone]   [facility]    [phone]   [place] and [place] [address], [place] Phone:  [phone], Fax:  [phone] Hours of Operation:  9 am - 6 pm, M-F.  Also accepts Medicaid/Medicare and self-pay.  [facility] for [place] [address], [address], [place] Phone: [phone], Fax: [phone]. Hours of Operation:  8:30 am - 5:30 pm, M-F.  Also accepts Medicaid and self-pay.  [facility] [place] [address] [place] Phone: [phone]   [place], [place], [place] [phone], Ext. [phone] Mondays & Thursdays: 7-9 AM.  First 15 patients are seen on a first come, first serve basis.    [place] Providers:  Organization         Address  Phone   Notes  [facility] [address],  [phone] Also  accepts self-pay patients.  [facility] [address], [place]  [phone]   [place], [address], [place] [phone]   [facility] [address] [place] [phone]   Lucianne Lei [address], [place]   [phone] Only accepts [place] Access [place] patients after they have their name applied to their card.   Self-Pay (no insurance) in [facility]:  Organization         Address  Phone   Notes  Sickle Cell Patients, [facility] [place] [phone]   [facility] [place] [phone]   [facility] [place]  [place], [address], [place] [phone]   [facility]/Dr. Vista Lawman  [address], [place] or [place], [address], [place] [phone] Phone number for both [place] and [employer] locations is the same.  [facility] and [facility] [address] [place] [phone]   [facility] [address] [place] or [address] [phone]   [facility] [address] [place] [phone], phone; [phone], fax Sees patients 1st and 3rd Saturday of every month.  Must not qualify for public or private insurance (i.e. Medicaid, Medicare, [place] Health Choice, Veterans' Benefits)  Household income should be no more than 200% of the poverty level The clinic cannot treat you if you are pregnant or think you are pregnant  Sexually transmitted diseases are not treated at the clinic.    Dental Care: Organization         Address  Phone  Notes  [facility] [place] [phone] Accepts children up to age 35 who are enrolled in [place] or [place]; pregnant  women with a Medicaid card; and children who have applied for Medicaid or [place] Health Choice, but were declined, whose parents can pay a reduced fee at time of service.  [facility]  [address], [place] [phone] Accepts children up to age 20 who are enrolled in [place] or [place]; pregnant women with a Medicaid card; and children who have applied for Medicaid or  Health Choice, but were declined, whose parents can pay a reduced fee at time of service.  [place] Adult Dental Access PROGRAM  [facility] [phone] Patients are seen by appointment only. Walk-ins are not accepted. [place] will see patients 62 years of age and older. Monday - Tuesday (8am-5pm) Most Wednesdays (8:30-5pm) $30 per visit, cash only  [facility] Adult Dental Access PROGRAM  [address], [facility] [phone] Patients are seen by appointment only. Walk-ins are not accepted. [place] will see patients 34 years of age and older. One Wednesday Evening (Monthly: Volunteer Based).  $30 per visit, cash only  [place]  [phone] for adults; Children under age 53, call Graduate Pediatric Dentistry at [phone]. Children aged 33-14, please call [phone] to request a pediatric application.  Dental services are provided in all areas of dental care including fillings, crowns and bridges, complete and partial dentures, implants, gum treatment, root canals, and extractions. Preventive care is also provided. Treatment is provided to both adults and children. Patients are selected via a lottery and there is often a waiting list.   [facility] [address] [place]  [phone] [email or web address]   [facility] [address] [place], [place] [phone], Ext. [phone] Second and Fourth Thursday of each month, opens at 6:30 AM; Clinic ends at 9 AM.  Patients are  seen on a first-come first-served basis, and a limited number are seen during each clinic.   [facility]  [address] Hillard Danker [place], [place] [phone]   Eligibility Requirements You must have lived in [place], [place], or [place] counties for at least the last three months.   You cannot be eligible for state or federal sponsored [employer], including [employer], [place], or [employer].   You generally cannot be eligible for healthcare insurance through your employer.  How to apply: °Eligibility screenings are held every Tuesday and Wednesday afternoon from 1:00 pm until 4:00 pm. You do not need an appointment for the interview!  °[facility] [address], [place], [place] [phone]   °[facility]  [phone]   °[facility]  [phone]   °[facility]  [phone]   ° °Behavioral Health Resources in the Community: °Intensive Outpatient Programs °Organization         Address  Phone  Notes  °[facility] [address], [place], [place] [phone]   °[facility] [address], [place], [place] [phone]   °[facility] [address], [place], [place] ° [phone]   °[facility] [address],  °[place], [place] 1-800-853-5163 or 336-641-4981   °Substance Abuse Resources °Organization         Address  Phone  Notes  °[facility]  336-882-2125   °[facility]  336-784-9470   °[facility]  336-285-9073   °[facility]  336-845-3988   °Residential & Outpatient Substance Abuse Program  1-800-659-3381   °Psychological Services °Organization         Address  Phone  Notes  °[facility]  336- [phone]   °[facility]  336- 378-7881   °[facility] [address], [place] 1-800-853-5163 or 336-641-4981   ° °Mobile Crisis  Teams °Organization         Address  Phone  Notes  °[facility], Mobile Crisis Care Unit  1-877-626-1772   °Assertive °Psychotherapeutic Services ° [address] [place], [place] 336-834-9664   °Sharon DeEsch [address] °[place] 336-554-5454   ° °Self-Help/Support Groups °Organization         Address  Phone             Notes  °[facility] [place] - variety of support groups  336- 373-1402 Call for more information  °Narcotics Anonymous (NA), [facility] [address], °[place]  2 meetings at this location  ° °Residential Treatment Programs °Organization         Address  Phone  Notes  °[facility] [address],    °[place]  1-866-801-8205   °[facility] ° [address], [place], [place] [phone]   °[facility] [address], [place] 336-845-3988 Admissions: 8am-3pm M-F  °[facility] [address]    °[place], [place] 336-841-1104   °[facility] [address], [place], [place] 336-379-7146   °[facility] [address]  °[place], [place] 336-285-9073   °[facility] - Intensive Outpatient [address], [place], [place] 336-852-3033   °[facility] ([facility] [address]  °[place], [place] 1-877-615-2722 or 336-784-9470   °[facility] ([facility]) [address] [place], [place] 336-227-7417 Accepts Medicaid  °[facility] [address]  °[place] 1-800-659-3381 Substance Abuse/Addiction Treatment  ° °[place] [facility] Resources °Organization         Address  Phone  Notes  °[facility]  (888) 581-9988   °Julie Brannon, PhD [address] [place], [place]   (336) 349-5553 or (336) 951-0000   °[facility]   [address] °[place], [place] (336) 349-4454   °[facility] [address],   [place], [place] [phone](336) [phone]  Insurance/Medicaid/sponsorship through [facility]  [facility] [address]                                    [place], [place] [phone](336) [phone] Therapy/tele-psych/case  [facility] [address]   [place], [place] [phone](336) [phone]    Dr. Lolly MustacheArfeen  [phone](336) [phone]   [facility]  [employer] [facility] 1) [address], [place] 2) [address], [place] 3)  [address], [place] [phone](336) [phone](336) [phone]  [phone](336) [phone]   [facility] Child Abuse Hotline 405-022-2577(336) (716)[phone] or [phone](336) [phone] (After Hours)

## 2014-05-03 NOTE — ED Provider Notes (Signed)
  Medical screening examination/treatment/procedure(s) were performed by non-physician practitioner and as supervising physician I was immediately available for consultation/collaboration.   EKG Interpretation None         Gerhard Munch, MD 05/03/14 [phone]

## 2015-11-02 ENCOUNTER — Encounter (HOSPITAL_COMMUNITY): Payer: Self-pay | Admitting: *Deleted

## 2015-11-02 ENCOUNTER — Emergency Department (HOSPITAL_COMMUNITY)
Admission: EM | Admit: 2015-11-02 | Discharge: 2015-11-02 | Disposition: A | Discharge status: Home / Self Care | Payer: Self-pay | Attending: Emergency Medicine | Admitting: Emergency Medicine

## 2015-11-02 DIAGNOSIS — L089 Local infection of the skin and subcutaneous tissue, unspecified: Secondary | ICD-10-CM

## 2015-11-02 DIAGNOSIS — K027 Dental root caries: Secondary | ICD-10-CM

## 2015-11-02 DIAGNOSIS — F17210 Nicotine dependence, cigarettes, uncomplicated: Secondary | ICD-10-CM | POA: Insufficient documentation

## 2015-11-02 DIAGNOSIS — Z791 Long term (current) use of non-steroidal anti-inflammatories (NSAID): Secondary | ICD-10-CM | POA: Insufficient documentation

## 2015-11-02 DIAGNOSIS — Z9889 Other specified postprocedural states: Secondary | ICD-10-CM | POA: Insufficient documentation

## 2015-11-02 DIAGNOSIS — L03211 Cellulitis of face: Secondary | ICD-10-CM

## 2015-11-02 MED ORDER — PENICILLIN V POTASSIUM 500 MG PO TABS: 500.0000 mg | ORAL_TABLET | Freq: Four times a day (QID) | ORAL | Status: AC

## 2015-11-02 MED ORDER — DEXAMETHASONE SODIUM PHOSPHATE 10 MG/ML IJ SOLN
10.0000 mg | Freq: Once | INTRAMUSCULAR | Status: AC
  Administered 2015-11-02: 10 mg via INTRAVENOUS
  Filled 2015-11-02: qty 1

## 2015-11-02 MED ORDER — KETOROLAC TROMETHAMINE 30 MG/ML IJ SOLN
30.0000 mg | Freq: Once | INTRAMUSCULAR | Status: AC
  Administered 2015-11-02: 30 mg via INTRAVENOUS
  Filled 2015-11-02: qty 1

## 2015-11-02 MED ORDER — SODIUM CHLORIDE 0.9 % IV SOLN
3.0000 g | Freq: Once | INTRAVENOUS | Status: AC
  Administered 2015-11-02: 3 g via INTRAVENOUS
  Filled 2015-11-02 (×2): qty 3

## 2015-11-02 MED ORDER — ONDANSETRON HCL 4 MG/2ML IJ SOLN
4.0000 mg | Freq: Once | INTRAMUSCULAR | Status: AC
  Administered 2015-11-02: 4 mg via INTRAVENOUS
  Filled 2015-11-02: qty 2

## 2015-11-02 MED ORDER — PREDNISONE 20 MG PO TABS: 20.0000 mg | ORAL_TABLET | Freq: Two times a day (BID) | ORAL | Status: AC

## 2015-11-02 MED ORDER — MORPHINE SULFATE (PF) 4 MG/ML IV SOLN
4.0000 mg | Freq: Once | INTRAVENOUS | Status: AC
  Administered 2015-11-02: 4 mg via INTRAVENOUS
  Filled 2015-11-02: qty 1

## 2015-11-02 MED ORDER — HYDROCODONE-ACETAMINOPHEN 5-325 MG PO TABS: 2.0000 | ORAL_TABLET | ORAL | Status: AC | PRN
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
## 2015-11-02 NOTE — Discharge Instructions (Signed)
The swelling and pain around your tooth and face showed slowly improve. Return to ER with any spread of the infection pain or swelling into your neck, or any failure to improve over the next 48 hours. Contact dentist, or oral surgeon for follow-up appointment to discuss extraction of the infected tooth root.   Dental Caries Dental caries is tooth decay. This decay can cause a hole in teeth (cavity) that can get bigger and deeper over time. HOME CARE  Brush and floss your teeth. Do this at least two times a day.  Use a fluoride toothpaste.  Use a mouth rinse if told by your dentist or doctor.  Eat less sugary and starchy foods. Drink less sugary drinks.  Avoid snacking often on sugary and starchy foods. Avoid sipping often on sugary drinks.  Keep regular checkups and cleanings with your dentist.  Use fluoride supplements if told by your dentist or doctor.  Allow fluoride to be applied to teeth if told by your dentist or doctor.   This information is not intended to replace advice given to you by your health care provider. Make sure you discuss any questions you have with your health care provider.   Document Released: 07/02/2008 Document Revised: 10/14/2014 Document Reviewed: 09/25/2012 Elsevier Interactive Patient Education 2016 [employer]  Cellulitis Cellulitis is an infection of the skin and the tissue beneath it. The infected area is usually red and tender. Cellulitis occurs most often in the arms and lower legs.  CAUSES  Cellulitis is caused by bacteria that enter the skin through cracks or cuts in the skin. The most common types of bacteria that cause cellulitis are staphylococci and streptococci. SIGNS AND SYMPTOMS   Redness and warmth.  Swelling.  Tenderness or pain.  Fever. DIAGNOSIS  Your health care provider can usually determine what is wrong based on a physical exam. Blood tests may also be done. TREATMENT  Treatment usually involves taking an antibiotic  medicine. HOME CARE INSTRUCTIONS   Take your antibiotic medicine as directed by your health care provider. Finish the antibiotic even if you start to feel better.  Keep the infected arm or leg elevated to reduce swelling.  Apply a warm cloth to the affected area up to 4 times per day to relieve pain.  Take medicines only as directed by your health care provider.  Keep all follow-up visits as directed by your health care provider. SEEK MEDICAL CARE IF:   You notice red streaks coming from the infected area.  Your red area gets larger or turns dark in color.  Your bone or joint underneath the infected area becomes painful after the skin has healed.  Your infection returns in the same area or another area.  You notice a swollen bump in the infected area.  You develop new symptoms.  You have a fever. SEEK IMMEDIATE MEDICAL CARE IF:   You feel very sleepy.  You develop vomiting or diarrhea.  You have a general ill feeling (malaise) with muscle aches and pains.   This information is not intended to replace advice given to you by your health care provider. Make sure you discuss any questions you have with your health care provider.   Document Released: 07/03/2005 Document Revised: 06/14/2015 Document Reviewed: 12/09/2011 Elsevier Interactive Patient Education [employer]

## 2015-11-02 NOTE — ED Notes (Signed)
Pt reports left dental pain with facial swelling and difficulty swallowing.

## 2016-08-04 ENCOUNTER — Emergency Department (HOSPITAL_COMMUNITY)
Admission: EM | Admit: 2016-08-04 | Discharge: 2016-08-07 | Disposition: E | Payer: Self-pay | Attending: Emergency Medicine | Admitting: Emergency Medicine

## 2016-08-04 ENCOUNTER — Other Ambulatory Visit: Payer: Self-pay | Admitting: Emergency Medicine

## 2016-08-04 ENCOUNTER — Encounter (HOSPITAL_COMMUNITY): Payer: Self-pay | Admitting: Emergency Medicine

## 2016-08-04 DIAGNOSIS — Y999 Unspecified external cause status: Secondary | ICD-10-CM | POA: Insufficient documentation

## 2016-08-04 DIAGNOSIS — S21139A Puncture wound without foreign body of unspecified front wall of thorax without penetration into thoracic cavity, initial encounter: Secondary | ICD-10-CM

## 2016-08-04 DIAGNOSIS — Y939 Activity, unspecified: Secondary | ICD-10-CM | POA: Insufficient documentation

## 2016-08-04 DIAGNOSIS — Y929 Unspecified place or not applicable: Secondary | ICD-10-CM | POA: Insufficient documentation

## 2016-08-04 DIAGNOSIS — W3400XA Accidental discharge from unspecified firearms or gun, initial encounter: Secondary | ICD-10-CM | POA: Insufficient documentation

## 2016-08-04 DIAGNOSIS — S21309A Unspecified open wound of unspecified front wall of thorax with penetration into thoracic cavity, initial encounter: Secondary | ICD-10-CM | POA: Insufficient documentation

## 2016-08-05 ENCOUNTER — Encounter (HOSPITAL_COMMUNITY): Payer: Self-pay | Admitting: *Deleted

## 2016-08-07 DIAGNOSIS — 419620001 Death: Secondary | SNOMED CT

## 2016-08-07 NOTE — ED Notes (Signed)
Dr. Wilkie AyeHorton at bedside with ultrasound machine to assess for cardiac activity-no cardiac movement detected
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
## 2016-08-07 NOTE — ED Notes (Signed)
Pt hands bagged with paper bags

## 2016-08-07 NOTE — ED Notes (Signed)
RN called bed control at this time

## 2016-08-07 NOTE — ED Notes (Signed)
Patient time of death occurred at 0201 on Sep 19, 2016.
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
## 2016-08-07 NOTE — ED Notes (Signed)
Elpidio GaleaJacque Perkins, [place] contacted for Dr. Wilkie AyeHorton
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
## 2016-08-07 NOTE — ED Notes (Signed)
[facility] notified at this time- reference [phone]#07/12/[id number]

## 2016-08-07 DEATH — deceased
